# Patient Record
Sex: Female | Born: 1990 | Race: Black or African American | Hispanic: No | Marital: Single | Smoking: Never smoker
Health system: Southern US, Community
[De-identification: ages and names within clinical notes are randomized; demographics above are authoritative.]

## PROBLEM LIST (undated history)

## (undated) HISTORY — PX: ECTOPIC PREGNANCY SURGERY: SHX613

---
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
## 2011-02-04 ENCOUNTER — Emergency Department: Payer: Self-pay

## 2011-09-28 ENCOUNTER — Emergency Department: Payer: Self-pay | Admitting: Emergency Medicine

## 2011-09-28 LAB — CBC
HCT: 41.3 % (ref 35.0–47.0)
HGB: 12.7 g/dL (ref 12.0–16.0)
MCH: 25.7 pg — ABNORMAL LOW (ref 26.0–34.0)
MCHC: 30.7 g/dL — ABNORMAL LOW (ref 32.0–36.0)
MCV: 84 fL (ref 80–100)
Platelet: 270 10*3/uL (ref 150–440)
RDW: 13.3 % (ref 11.5–14.5)

## 2011-09-28 LAB — URINALYSIS, COMPLETE
Blood: NEGATIVE
Nitrite: NEGATIVE
Ph: 6 (ref 4.5–8.0)
Protein: NEGATIVE
RBC,UR: 1 /HPF (ref 0–5)
Specific Gravity: 1.028 (ref 1.003–1.030)
Squamous Epithelial: 6

## 2011-09-28 LAB — COMPREHENSIVE METABOLIC PANEL
Albumin: 3.9 g/dL (ref 3.4–5.0)
Anion Gap: 6 — ABNORMAL LOW (ref 7–16)
BUN: 14 mg/dL (ref 7–18)
Bilirubin,Total: 0.3 mg/dL (ref 0.2–1.0)
Chloride: 104 mmol/L (ref 98–107)
Co2: 29 mmol/L (ref 21–32)
EGFR (Non-African Amer.): >60
Osmolality: 278 (ref 275–301)
Sodium: 139 mmol/L (ref 136–145)

## 2011-09-28 LAB — PREGNANCY, URINE: Pregnancy Test, Urine: NEGATIVE m[IU]/mL

## 2011-09-28 LAB — LIPASE, BLOOD: Lipase: 166 U/L (ref 73–393)

## 2012-12-10 ENCOUNTER — Emergency Department: Payer: Self-pay | Admitting: Internal Medicine

## 2014-01-19 ENCOUNTER — Emergency Department: Payer: Self-pay | Admitting: Emergency Medicine

## 2014-04-10 ENCOUNTER — Emergency Department: Payer: Self-pay | Admitting: Emergency Medicine

## 2014-06-03 ENCOUNTER — Emergency Department: Payer: Self-pay | Admitting: Emergency Medicine

## 2014-06-03 LAB — HCG, QUANTITATIVE, PREGNANCY: Beta Hcg, Quant.: 6622 m[IU]/mL — ABNORMAL HIGH

## 2014-06-03 LAB — URINALYSIS, COMPLETE
BLOOD: NEGATIVE
Bilirubin,UR: NEGATIVE
GLUCOSE, UR: NEGATIVE mg/dL (ref 0–75)
KETONE: NEGATIVE
LEUKOCYTE ESTERASE: NEGATIVE
Nitrite: NEGATIVE
Ph: 6 (ref 4.5–8.0)
Protein: NEGATIVE
RBC,UR: 1 /HPF (ref 0–5)
SPECIFIC GRAVITY: 1.026 (ref 1.003–1.030)
Squamous Epithelial: 3
WBC UR: 1 /HPF (ref 0–5)

## 2014-06-03 LAB — CBC WITH DIFFERENTIAL/PLATELET
Basophil #: 0 10*3/uL (ref 0.0–0.1)
Basophil %: 0.4 %
EOS ABS: 0.2 10*3/uL (ref 0.0–0.7)
Eosinophil %: 2.7 %
HCT: 38.7 % (ref 35.0–47.0)
HGB: 12.4 g/dL (ref 12.0–16.0)
Lymphocyte #: 2.6 10*3/uL (ref 1.0–3.6)
Lymphocyte %: 34.7 %
MCH: 27.1 pg (ref 26.0–34.0)
MCHC: 32.1 g/dL (ref 32.0–36.0)
MCV: 85 fL (ref 80–100)
MONO ABS: 0.5 x10 3/mm (ref 0.2–0.9)
Monocyte %: 6.1 %
NEUTROS ABS: 4.1 10*3/uL (ref 1.4–6.5)
NEUTROS PCT: 56.1 %
Platelet: 252 10*3/uL (ref 150–440)
RBC: 4.58 10*6/uL (ref 3.80–5.20)
RDW: 13.2 % (ref 11.5–14.5)
WBC: 7.4 10*3/uL (ref 3.6–11.0)

## 2014-06-03 LAB — COMPREHENSIVE METABOLIC PANEL
ANION GAP: 7 (ref 7–16)
Albumin: 3.6 g/dL (ref 3.4–5.0)
Alkaline Phosphatase: 62 U/L
BUN: 15 mg/dL (ref 7–18)
Bilirubin,Total: 0.2 mg/dL (ref 0.2–1.0)
CHLORIDE: 105 mmol/L (ref 98–107)
Calcium, Total: 8.8 mg/dL (ref 8.5–10.1)
Co2: 28 mmol/L (ref 21–32)
Creatinine: 0.91 mg/dL (ref 0.60–1.30)
EGFR (Non-African Amer.): >60
Glucose: 118 mg/dL — ABNORMAL HIGH (ref 65–99)
Osmolality: 281 (ref 275–301)
POTASSIUM: 3.8 mmol/L (ref 3.5–5.1)
SGOT(AST): 20 U/L (ref 15–37)
SGPT (ALT): 20 U/L
Sodium: 140 mmol/L (ref 136–145)
TOTAL PROTEIN: 7.1 g/dL (ref 6.4–8.2)

## 2014-06-03 LAB — LIPASE, BLOOD: Lipase: 142 U/L (ref 73–393)

## 2014-06-04 ENCOUNTER — Ambulatory Visit: Payer: Self-pay | Admitting: Obstetrics and Gynecology

## 2014-06-04 LAB — CBC
HCT: 40.1 % (ref 35.0–47.0)
HGB: 12.9 g/dL (ref 12.0–16.0)
MCH: 27.2 pg (ref 26.0–34.0)
MCHC: 32.1 g/dL (ref 32.0–36.0)
MCV: 85 fL (ref 80–100)
PLATELETS: 246 10*3/uL (ref 150–440)
RBC: 4.73 10*6/uL (ref 3.80–5.20)
RDW: 13.2 % (ref 11.5–14.5)
WBC: 6.8 10*3/uL (ref 3.6–11.0)

## 2014-06-04 LAB — HCG, QUANTITATIVE, PREGNANCY: Beta Hcg, Quant.: 2699 m[IU]/mL — ABNORMAL HIGH

## 2014-09-10 LAB — SURGICAL PATHOLOGY

## 2014-09-16 NOTE — Op Note (Signed)
PATIENT NAME:  Megan Benson, Megan Benson MR#:  [id number] DATE OF BIRTH:  [birth year]  DATE OF PROCEDURE:  06/04/2014  PREOPERATIVE DIAGNOSIS:  Suspected ruptured right ectopic pregnancy.   POSTOPERATIVE DIAGNOSES:  1.  Suspected ruptured right ectopic pregnancy.  2.  Right ovarian cyst consistent with dermoid.   PROCEDURES:  1. Operative laparoscopy.  2. Right salpingectomy.  3. Removal of ectopic pregnancy.  4. Right ovarian cystectomy.   ANESTHESIA: General and local 0.5% Xylocaine plain.   SURGEON: Prentice Docker, MD.   ESTIMATED BLOOD LOSS: 75 mL.   OPERATIVE FLUIDS: 1200 mL crystalloid.   COMPLICATIONS: None.   FINDINGS:  1.  Large, edematous, right fallopian tube with likely mass in distal end with no evidence of torsion.  2.  Several centimeter cyst on right ovary with apparent hair-like contents, consistent with dermoid.  3.  Multiple filmy adhesions throughout the entire pelvis especially from the posterior uterus to the colon and right fallopian tube and left fallopian tube to the colon.  4.  Filmy string-like adhesion of liver to the anterior abdominal wall.   SPECIMENS:  1.  Right fallopian tube with suspected ectopic pregnancy contained.  2.  Right ovarian cyst.   CONDITION AT THE END OF THE PROCEDURE: Stable.   PROCEDURE IN DETAIL: The patient was taken to the operating room where general anesthesia was administered and found to be adequate. The patient was placed in the dorsal supine lithotomy position in [place] stirrups and prepped and draped in the usual fashion. Notably the patient was placed in a way as to minimize risk of nerve and vascular injury. After a timeout was called a red rubber catheter was placed in her bladder for in and out catheterization of about 200 mL of clear urine. A sterile speculum was placed in the vagina and a single-tooth tenaculum was affixed to the anterior lip of the cervix. An acorn uterine manipulator was affixed to the tenaculum. The  speculum was then removed.   Attention was turned to the abdomen where after injection of local anesthetic a supraumbilical 5 mm incision was made and entry was gained to the abdomen through a trocar using the Optiview trocar technique with the camera. Verification of entry was undertaken using opening pressures. After verification the abdomen was insufflated with CO2. The camera was reintroduced through the port and atraumatic entry was verified. A survey of the patient's pelvis and abdomen was undertaken with the above-noted findings after placing the patient in steep Trendelenburg. A left lower quadrant 12 mm port was placed under direct intra-abdominal camera visualization without difficulty. A 5 mm suprapubic port was placed under direct intra-abdominal camera visualization without difficulty. Each of these last 2 ports were placed after administration of local anesthetic. The right fallopian tube was identified and all filmy adhesions were taken down. The course of the fallopian tube was traced to insure that no torsion had taken place given that there was a mass on the right ovary noted. The tube coursed normally until it reached the ampullary end near the fimbria, where it became enlarged and was edematous and there appeared to be spillage of blood coming from that end. Starting in the medial portion of the fallopian tube the fallopian tube was grasped with a grasper and the Harmonic scalpel was used to transect the fallopian tube near the cornual region. Along the edge of the mesosalpinx the fallopian tube was dissected using Harmonic scalpel following it all the way out near the distal end. The distal fimbriated  end was then identified and grasped and bit by bit it was freed up to insure that the IP ligament was not included in the cauterization. At this point the right fallopian tube and contents were liberated from the right adnexa. Hemostasis was noted along the vascular pedicle.   Attention was  turned to the right ovary where there was noted to be a several centimeter cyst with apparent hair-like contents inside. This was elevated and then an attempt was made to cut through the ovarian stroma just underneath this tissue, however the cyst was somewhat transected using the Harmonic scalpel. Once the cyst was removed the specimen was removed through the left lower quadrant port using an Endo Catch bag. After removal of the cyst the ectopic pregnancy and fallopian tube were removed in a similar fashion through an Endo Catch bag through the left lower quadrant port. Given that the dermoid cyst was ruptured, the abdomen and pelvis were copiously irrigated and all products that could possibly be removed were attempted to be removed. The bed of the cyst was then cauterized using bipolar electrocautery to burn any remnants of the cyst wall. Hemostasis was noted. At this point a generalized inspection again was taken of the abdomen and pelvis to confirm the above-noted findings and that as much fluid as possible was removed from the pelvis and abdomen. After the patient was taken out of Trendelenburg another survey was taken to insure that all fluid was removed. At this point the vascular pedicle was hemostatic and the cyst bed of the ovary was hemostatic. This concluded the procedure. The abdomen was desufflated of CO2 after removal of all instrumentation. The trocars were removed. Each port site was closed in the subcutaneous area using a number 4-0 Vicryl and the left lower quadrant skin was closed in a subcuticular fashion using 4-0 Vicryl undyed in a subcuticular fashion. Each incision was reapproximated using Dermabond. An additional 10 mL of 0.5% Xylocaine plain was injected distributed among the 3 incision sites.   Attention was turned to the pelvic region again where after removal of the acorn manipulator a speculum was placed and the tenaculum was removed from the cervix and hemostasis was noted. The  speculum was removed from the vagina after verifying no other instrumentation or sponges were left in the vagina.   The patient tolerated the procedure well. Sponge, lap, and needle counts were correct x 2. The patient was awakened in the operating room and taken to the recovery area in stable condition.    ____________________________ Will Bonnet, MD sdj:bu D: 06/04/2014 19:00:00 ET T: 06/04/2014 20:29:37 ET JOB#: [id number]  cc: Will Bonnet, MD, <Dictator> Will Bonnet MD ELECTRONICALLY SIGNED 06/20/2014 0:08

## 2014-09-21 ENCOUNTER — Emergency Department
Admission: EM | Admit: 2014-09-21 | Discharge: 2014-09-21 | Disposition: A | Discharge status: Home / Self Care | Payer: 59 | Attending: Emergency Medicine | Admitting: Emergency Medicine

## 2014-09-21 ENCOUNTER — Encounter: Payer: Self-pay | Admitting: Emergency Medicine

## 2014-09-21 ENCOUNTER — Emergency Department: Payer: 59

## 2014-09-21 DIAGNOSIS — R102 Pelvic and perineal pain: Secondary | ICD-10-CM | POA: Diagnosis not present

## 2014-09-21 DIAGNOSIS — Z3202 Encounter for pregnancy test, result negative: Secondary | ICD-10-CM | POA: Insufficient documentation

## 2014-09-21 DIAGNOSIS — R1032 Left lower quadrant pain: Secondary | ICD-10-CM | POA: Diagnosis present

## 2014-09-21 LAB — URINALYSIS COMPLETE WITH MICROSCOPIC (ARMC ONLY)
BACTERIA UA: NONE SEEN
Bilirubin Urine: NEGATIVE
Glucose, UA: NEGATIVE mg/dL
Hgb urine dipstick: NEGATIVE
KETONES UR: NEGATIVE mg/dL
Leukocytes, UA: NEGATIVE
Nitrite: NEGATIVE
Protein, ur: NEGATIVE mg/dL
SPECIFIC GRAVITY, URINE: 1.025 (ref 1.005–1.030)
pH: 5 (ref 5.0–8.0)

## 2014-09-21 LAB — POCT PREGNANCY, URINE: Preg Test, Ur: NEGATIVE

## 2014-09-21 LAB — CBC WITH DIFFERENTIAL/PLATELET
Basophils Absolute: 0.1 10*3/uL (ref 0–0.1)
Basophils Relative: 1 %
Eosinophils Absolute: 0.7 10*3/uL (ref 0–0.7)
Eosinophils Relative: 7 %
HCT: 39.6 % (ref 35.0–47.0)
HEMOGLOBIN: 12.7 g/dL (ref 12.0–16.0)
LYMPHS ABS: 4.8 10*3/uL — AB (ref 1.0–3.6)
LYMPHS PCT: 50 %
MCH: 26.7 pg (ref 26.0–34.0)
MCHC: 32.2 g/dL (ref 32.0–36.0)
MCV: 83.1 fL (ref 80.0–100.0)
MONOS PCT: 6 %
Monocytes Absolute: 0.6 10*3/uL (ref 0.2–0.9)
NEUTROS ABS: 3.4 10*3/uL (ref 1.4–6.5)
Neutrophils Relative %: 36 %
PLATELETS: 226 10*3/uL (ref 150–440)
RBC: 4.76 MIL/uL (ref 3.80–5.20)
RDW: 12.6 % (ref 11.5–14.5)
WBC: 9.6 10*3/uL (ref 3.6–11.0)

## 2014-09-21 LAB — BASIC METABOLIC PANEL
ANION GAP: 5 (ref 5–15)
BUN: 11 mg/dL (ref 6–20)
CALCIUM: 8.8 mg/dL — AB (ref 8.9–10.3)
CHLORIDE: 106 mmol/L (ref 101–111)
CO2: 27 mmol/L (ref 22–32)
Creatinine, Ser: 0.93 mg/dL (ref 0.44–1.00)
GFR calc Af Amer: >60 mL/min (ref >60)
GFR calc non Af Amer: >60 mL/min (ref >60)
GLUCOSE: 90 mg/dL (ref 65–99)
POTASSIUM: 3.5 mmol/L (ref 3.5–5.1)
Sodium: 138 mmol/L (ref 135–145)

## 2014-09-21 MED ORDER — IBUPROFEN 400 MG PO TABS
ORAL_TABLET | ORAL | Status: AC
  Administered 2014-09-21: 800 mg via ORAL
  Filled 2014-09-21: qty 2

## 2014-09-21 MED ORDER — IBUPROFEN 800 MG PO TABS
800.0000 mg | ORAL_TABLET | Freq: Once | ORAL | Status: AC
  Administered 2014-09-21: 800 mg via ORAL

## 2014-09-21 NOTE — ED Notes (Signed)
Upreg negative.

## 2014-09-21 NOTE — Discharge Instructions (Signed)
Abdominal Pain     please follow up with [facility]. Call on Monday for an appointment this week.   Many things can cause belly (abdominal) pain. Most times, the belly pain is not dangerous. Many cases of belly pain can be watched and treated at home. HOME CARE   Do not take medicines that help you go poop (laxatives) unless told to by your doctor.  Only take medicine as told by your doctor.  Eat or drink as told by your doctor. Your doctor will tell you if you should be on a special diet. GET HELP IF:  You do not know what is causing your belly pain.  You have belly pain while you are sick to your stomach (nauseous) or have runny poop (diarrhea).  You have pain while you pee or poop.  Your belly pain wakes you up at night.  You have belly pain that gets worse or better when you eat.  You have belly pain that gets worse when you eat fatty foods.  You have a fever. GET HELP RIGHT AWAY IF:   The pain does not go away within 2 hours.  You keep throwing up (vomiting).  The pain changes and is only in the right or left part of the belly.  You have bloody or tarry looking poop. MAKE SURE YOU:   Understand these instructions.  Will watch your condition.  Will get help right away if you are not doing well or get worse. Document Released: 10/21/2007 Document Revised: 05/09/2013 Document Reviewed: 01/11/2013 [facility] Patient Information 2015 [place], [place]. This information is not intended to replace advice given to you by your health care provider. Make sure you discuss any questions you have with your health care provider.

## 2014-09-21 NOTE — ED Notes (Signed)
PT in with co left lower abd pain for a week, no n.v.d or dysuria.

## 2014-09-21 NOTE — ED Provider Notes (Signed)
[facility] Emergency Department Provider Note    ____________________________________________  Time seen: 1:28 AM  I have reviewed the triage vital signs and the nursing notes.   HISTORY  Chief Complaint Abdominal Pain       HPI Megan Benson is a 24 y.o. female who presents with left lower quadrant pain. Patient states that over the last month she has experienced off and on brief pains in the left lower abdomen. She is states that this does not seem to be associated with any fever, vaginal discharge, vomiting, or other concerns. She describes a pain that is achy and occasionally brief and sharp in the left lower quadrant. She did note that it can sometimes be worse with walking, but Present is only very minimal.  Patient does state she had a prior ectopic pregnancy which caused similar right-sided pain, however is not pregnant today.  Patient denies any vaginal discharge vaginal bleeding, or other concerns.  Patient is eating well.  Patient denies past medical history other than a previous ectopic that required surgery and removal of her right tube.  No past medical history on file.  There are no active problems to display for this patient.   No past surgical history on file.  No current outpatient prescriptions on file.  Allergies Review of patient's allergies indicates no known allergies.  No family history on file.  Social History History  Substance Use Topics  . Smoking status: Not on file  . Smokeless tobacco: Not on file  . Alcohol Use: Not on file   The patient does not smoke and does not drink and does not use any illegal drugs  Review of Systems  Constitutional: Negative for fever. Eyes: Negative for visual changes. ENT: Negative for sore throat. Cardiovascular: Negative for chest pain. Respiratory: Negative for shortness of breath. Gastrointestinal: Negative for abdominal pain except as noted in the history of  present illness. No right upper quadrant pain and no right lower quadrant pain, vomiting and diarrhea. Genitourinary: Negative for dysuria. Musculoskeletal: Negative for back pain. Skin: Negative for rash. Neurological: Negative for headaches, focal weakness or numbness.   10-point ROS otherwise negative.  ____________________________________________   PHYSICAL EXAM:  VITAL SIGNS: ED Triage Vitals  Enc Vitals Group     BP 09/21/14 0035 111/61 mmHg     Pulse Rate 09/21/14 0035 81     Resp 09/21/14 0035 18     Temp 09/21/14 0035 98.2 F (36.8 C)     Temp src --      SpO2 09/21/14 0035 100 %     Weight 09/21/14 0035 236 lb (107.049 kg)     Height 09/21/14 0035 5' (1.524 m)     Head Cir --      Peak Flow --      Pain Score 09/21/14 0035 7     Pain Loc --      Pain Edu? --      Excl. in [place]? --      Constitutional: Alert and oriented. Well appearing and in no distress. Eyes: Conjunctivae are normal. PERRL. Normal extraocular movements. ENT   Head: Normocephalic and atraumatic.   Nose: No congestion/rhinnorhea.   Mouth/Throat: Mucous membranes are moist.   Neck: No stridor. Hematological/Lymphatic/Immunilogical: No cervical lymphadenopathy. Cardiovascular: Normal rate, regular rhythm. Normal and symmetric distal pulses are present in all extremities. No murmurs, rubs, or gallops. Respiratory: Normal respiratory effort without tachypnea nor retractions. Breath sounds are clear and equal bilaterally. No wheezes/rales/rhonchi. Gastrointestinal:  Soft and nontender. No distention. No abdominal bruits. There is no CVA tenderness. With deep palpation of the left lower quadrant there is no noted pain at this time to exam. The patient's abdominal exam is completely normal. Genitourinary:  Musculoskeletal: Nontender with normal range of motion in all extremities. No joint effusions.  No lower extremity tenderness nor edema. Neurologic:  Normal speech and language. No gross  focal neurologic deficits are appreciated. Speech is normal. No gait instability. Skin:  Skin is warm, dry and intact. No rash noted. Psychiatric: Mood and affect are normal. Speech and behavior are normal. Patient exhibits appropriate insight and judgment.  ____________________________________________    LABS (pertinent positives/negatives)  Labs Reviewed  CBC WITH DIFFERENTIAL/PLATELET - Abnormal; Notable for the following:    Lymphs Abs 4.8 (*)    All other components within normal limits  BASIC METABOLIC PANEL - Abnormal; Notable for the following:    Calcium 8.8 (*)    All other components within normal limits  URINALYSIS COMPLETEWITH MICROSCOPIC ([facility])  - Abnormal; Notable for the following:    Color, Urine YELLOW (*)    APPearance HAZY (*)    Squamous Epithelial / LPF 6-30 (*)    All other components within normal limits  POC URINE PREG, ED   Urine pregnancy test was negative.  ____________________________________________   EKG     ____________________________________________    RADIOLOGY  TV US IMPRESSION: Simple unilocular 2.9 cm left ovarian cyst. Normal uterus and right ovary.  ____________________________________________   PROCEDURES  Procedure(s) performed: None  Critical Care performed: No  ____________________________________________   INITIAL IMPRESSION / ASSESSMENT AND PLAN / ED COURSE  Pertinent labs & imaging results that were available during my care of the patient were reviewed by me and considered in my medical decision making (see chart for details).  Patient presents with subacute left lower quadrant abdominal pain which is very brief and intermittent in nature. At present she describes that she only has a very mild achy sensation, occasionally the pain is sharp though. Patient is afebrile, her labs are reviewed and essentially no acute abnormalities are noted. Patient's lab exam is very much reassuring of the abdomen. At this  point, I suspect that there is unlikely to be an acute surgical process in the abdomen, and the patient's UPT is notably negative. At this point we will obtain a transvaginal ultrasound to evaluate for possible cystic process mass, or evidence that could suggest the patient may be experiencing intermittent torsion, though I feel clinically this is unlikely.  Insert time stamp. Patient resting comfortably at this time. Discussed with the patient that there is a small 3 cm ovarian cyst on the left. This may cause some occasional pain, but I am not 329% certain that this is the etiology of her discomfort. I did discuss with the patient signs and symptoms of torsion, and should she develop any sudden severe left lower quadrant or pelvic pain that she needs to seek medical attention in the ER right away.  Patient agrees to follow up with her OB doctor at [facility] in the next week.  Discharge the patient with return precautions including development of any fever, vomiting, diarrhea, vaginal discharge, worsening pain, continuous pain in the left lower quadrant, or other new concerns arise.  ____________________________________________   FINAL CLINICAL IMPRESSION(S) / ED DIAGNOSES  Final diagnoses:  Pelvic pain in female     Delman Kitten, MD 09/21/14 [phone]

## 2015-01-20 ENCOUNTER — Encounter: Payer: Self-pay | Admitting: Medical Oncology

## 2015-01-20 ENCOUNTER — Emergency Department
Admission: EM | Admit: 2015-01-20 | Discharge: 2015-01-20 | Disposition: A | Discharge status: Home / Self Care | Payer: 59 | Attending: Emergency Medicine | Admitting: Emergency Medicine

## 2015-01-20 DIAGNOSIS — R350 Frequency of micturition: Secondary | ICD-10-CM | POA: Diagnosis present

## 2015-01-20 DIAGNOSIS — N390 Urinary tract infection, site not specified: Secondary | ICD-10-CM | POA: Diagnosis not present

## 2015-01-20 LAB — URINALYSIS COMPLETE WITH MICROSCOPIC (ARMC ONLY)
BACTERIA UA: NONE SEEN
Bilirubin Urine: NEGATIVE
Glucose, UA: NEGATIVE mg/dL
Ketones, ur: NEGATIVE mg/dL
Nitrite: NEGATIVE
Protein, ur: 100 mg/dL — AB
Specific Gravity, Urine: 1.023 (ref 1.005–1.030)
pH: 6 (ref 5.0–8.0)

## 2015-01-20 MED ORDER — PHENAZOPYRIDINE HCL 200 MG PO TABS
200.0000 mg | ORAL_TABLET | Freq: Three times a day (TID) | ORAL | Status: DC | PRN
Start: 2015-01-20 — End: 2015-10-28

## 2015-01-20 MED ORDER — CEPHALEXIN 500 MG PO CAPS: 500.0000 mg | ORAL_CAPSULE | Freq: Two times a day (BID) | ORAL | Status: DC

## 2015-01-20 MED ORDER — CEPHALEXIN 500 MG PO CAPS
500.0000 mg | ORAL_CAPSULE | Freq: Once | ORAL | Status: AC
  Administered 2015-01-20: 500 mg via ORAL
  Filled 2015-01-20: qty 1

## 2015-01-20 NOTE — ED Notes (Signed)
Pt reports urinary pain and frequency that began today. Denies other sx's.

## 2015-01-20 NOTE — Discharge Instructions (Signed)
Urinary Tract Infection Urinary tract infections (UTIs) can develop anywhere along your urinary tract. Your urinary tract is your body's drainage system for removing wastes and extra water. Your urinary tract includes two kidneys, two ureters, a bladder, and a urethra. Your kidneys are a pair of bean-shaped organs. Each kidney is about the size of your fist. They are located below your ribs, one on each side of your spine. CAUSES Infections are caused by microbes, which are microscopic organisms, including fungi, viruses, and bacteria. These organisms are so small that they can only be seen through a microscope. Bacteria are the microbes that most commonly cause UTIs. SYMPTOMS  Symptoms of UTIs may vary by age and gender of the patient and by the location of the infection. Symptoms in young women typically include a frequent and intense urge to urinate and a painful, burning feeling in the bladder or urethra during urination. Older women and men are more likely to be tired, shaky, and weak and have muscle aches and abdominal pain. A fever may mean the infection is in your kidneys. Other symptoms of a kidney infection include pain in your back or sides below the ribs, nausea, and vomiting. DIAGNOSIS To diagnose a UTI, your caregiver will ask you about your symptoms. Your caregiver also will ask to provide a urine sample. The urine sample will be tested for bacteria and white blood cells. White blood cells are made by your body to help fight infection. TREATMENT  Typically, UTIs can be treated with medication. Because most UTIs are caused by a bacterial infection, they usually can be treated with the use of antibiotics. The choice of antibiotic and length of treatment depend on your symptoms and the type of bacteria causing your infection. HOME CARE INSTRUCTIONS  If you were prescribed antibiotics, take them exactly as your caregiver instructs you. Finish the medication even if you feel better after you  have only taken some of the medication.  Drink enough water and fluids to keep your urine clear or pale yellow.  Avoid caffeine, tea, and carbonated beverages. They tend to irritate your bladder.  Empty your bladder often. Avoid holding urine for long periods of time.  Empty your bladder before and after sexual intercourse.  After a bowel movement, women should cleanse from front to back. Use each tissue only once. SEEK MEDICAL CARE IF:   You have back pain.  You develop a fever.  Your symptoms do not begin to resolve within 3 days. SEEK IMMEDIATE MEDICAL CARE IF:   You have severe back pain or lower abdominal pain.  You develop chills.  You have nausea or vomiting.  You have continued burning or discomfort with urination. MAKE SURE YOU:   Understand these instructions.  Will watch your condition.  Will get help right away if you are not doing well or get worse. Document Released: 02/11/2005 Document Revised: 11/03/2011 Document Reviewed: 06/12/2011 [facility] Patient Information 2015 [place], [place]. This information is not intended to replace advice given to you by your health care provider. Make sure you discuss any questions you have with your health care provider.  Take the prescription antibiotic as directed until completely gone. Take the pain medicine as needed. Increase fluid intake to keep urine clear. Follow-up with [facility] as needed.

## 2015-01-20 NOTE — ED Provider Notes (Signed)
[facility] Emergency Department Provider Note ____________________________________________  Time seen: 1805  I have reviewed the triage vital signs and the nursing notes.  HISTORY  Chief Complaint  Urinary Frequency  HPI Megan Benson is a 24 y.o. female reports to the ED with sudden onset of urinary burning and urgency. She does note also some scant blood when she wipes. She denies any fevers, chills, or sweats. She is been without any pelvic pain, abdominal pain or flank pain.She reports her pain at a 6/10 in triage.  History reviewed. No pertinent past medical history.  There are no active problems to display for this patient.  Past Surgical History  Procedure Laterality Date  . Ectopic pregnancy surgery      Current Outpatient Rx  Name  Route  Sig  Dispense  Refill  . cephALEXin (KEFLEX) 500 MG capsule   Oral   Take 1 capsule (500 mg total) by mouth 2 (two) times daily.   14 capsule   0   . phenazopyridine (PYRIDIUM) 200 MG tablet   Oral   Take 1 tablet (200 mg total) by mouth 3 (three) times daily as needed for pain.   20 tablet   0    Allergies Review of patient's allergies indicates no known allergies.  No family history on file.  Social History Social History  Substance Use Topics  . Smoking status: Never Smoker   . Smokeless tobacco: None  . Alcohol Use: No   Review of Systems  Constitutional: Negative for fever. Eyes: Negative for visual changes. ENT: Negative for sore throat. Cardiovascular: Negative for chest pain. Respiratory: Negative for shortness of breath. Gastrointestinal: Negative for abdominal pain, vomiting and diarrhea. Genitourinary: Positive for dysuria. Musculoskeletal: Negative for back pain. Skin: Negative for rash. Neurological: Negative for headaches, focal weakness or numbness. ____________________________________________  PHYSICAL EXAM:  VITAL SIGNS: ED Triage Vitals  Enc Vitals Group    BP 01/20/15 1700 118/80 mmHg     Pulse Rate 01/20/15 1700 87     Resp 01/20/15 1700 18     Temp 01/20/15 1700 98.2 F (36.8 C)     Temp Source 01/20/15 1700 Oral     SpO2 01/20/15 1700 100 %     Weight 01/20/15 1700 236 lb (107.049 kg)     Height 01/20/15 1700 5' (1.524 m)     Head Cir --      Peak Flow --      Pain Score 01/20/15 1816 6     Pain Loc --      Pain Edu? --      Excl. in [place]? --    Constitutional: Alert and oriented. Well appearing and in no distress. Eyes: Conjunctivae are normal. PERRL. Normal extraocular movements. ENT   Head: Normocephalic and atraumatic.   Nose: No congestion/rhinorrhea.   Mouth/Throat: Mucous membranes are moist.   Neck: Supple. No thyromegaly. Hematological/Lymphatic/Immunological: No cervical lymphadenopathy. Cardiovascular: Normal rate, regular rhythm.  Respiratory: Normal respiratory effort. No wheezes/rales/rhonchi. Gastrointestinal: Soft and nontender. No distention. Musculoskeletal: Nontender with normal range of motion in all extremities.  Neurologic:  Normal gait without ataxia. Normal speech and language. No gross focal neurologic deficits are appreciated. Skin:  Skin is warm, dry and intact. No rash noted. Psychiatric: Mood and affect are normal. Patient exhibits appropriate insight and judgment. ____________________________________________    LABS (pertinent positives/negatives) Labs Reviewed  URINALYSIS COMPLETEWITH MICROSCOPIC ([place] ONLY) - Abnormal; Notable for the following:    Color, Urine YELLOW (*)  APPearance CLOUDY (*)    Hgb urine dipstick 3+ (*)    Protein, ur 100 (*)    Leukocytes, UA 3+ (*)    Squamous Epithelial / LPF 0-5 (*)    All other components within normal limits  URINE CULTURE  UA with WBCs TNTC ____________________________________________  PROCEDURES  Keflex 500 mg PO ____________________________________________  INITIAL IMPRESSION / ASSESSMENT AND PLAN / ED COURSE  Treatment  for an acute UTI with Keflex and phenazopyridine. Patient to follow-up with her primary care provider for ongoing symptoms. Urine culture is pending. ____________________________________________  FINAL CLINICAL IMPRESSION(S) / ED DIAGNOSES  Final diagnoses:  UTI (lower urinary tract infection)     Melvenia Needles, PA-C 01/20/15 2050  Nena Polio, MD 01/20/15 [phone]

## 2015-01-22 LAB — URINE CULTURE: SPECIAL REQUESTS: NORMAL

## 2015-02-26 ENCOUNTER — Emergency Department: Payer: 59

## 2015-02-26 ENCOUNTER — Encounter: Payer: Self-pay | Admitting: Emergency Medicine

## 2015-02-26 ENCOUNTER — Emergency Department
Admission: EM | Admit: 2015-02-26 | Discharge: 2015-02-26 | Disposition: A | Discharge status: Home / Self Care | Payer: 59 | Attending: Emergency Medicine | Admitting: Emergency Medicine

## 2015-02-26 DIAGNOSIS — R1032 Left lower quadrant pain: Secondary | ICD-10-CM | POA: Insufficient documentation

## 2015-02-26 DIAGNOSIS — Z3202 Encounter for pregnancy test, result negative: Secondary | ICD-10-CM | POA: Insufficient documentation

## 2015-02-26 DIAGNOSIS — Z792 Long term (current) use of antibiotics: Secondary | ICD-10-CM | POA: Insufficient documentation

## 2015-02-26 LAB — URINALYSIS COMPLETE WITH MICROSCOPIC (ARMC ONLY)
BILIRUBIN URINE: NEGATIVE
Bacteria, UA: NONE SEEN
GLUCOSE, UA: NEGATIVE mg/dL
Hgb urine dipstick: NEGATIVE
Ketones, ur: NEGATIVE mg/dL
Leukocytes, UA: NEGATIVE
NITRITE: NEGATIVE
Protein, ur: NEGATIVE mg/dL
SPECIFIC GRAVITY, URINE: 1.023 (ref 1.005–1.030)
pH: 6 (ref 5.0–8.0)

## 2015-02-26 LAB — POCT PREGNANCY, URINE: Preg Test, Ur: NEGATIVE

## 2015-02-26 MED ORDER — ASPIRIN-ACETAMINOPHEN-CAFFEINE 250-250-65 MG PO TABS: 1.0000 | ORAL_TABLET | Freq: Three times a day (TID) | ORAL | Status: DC | PRN

## 2015-02-26 MED ORDER — IBUPROFEN 800 MG PO TABS: 800.0000 mg | ORAL_TABLET | Freq: Three times a day (TID) | ORAL | Status: DC | PRN

## 2015-02-26 NOTE — ED Provider Notes (Signed)
[facility] Emergency Department Provider Note  ____________________________________________  Time seen: Approximately 12:42 PM  I have reviewed the triage vital signs and the nursing notes.   HISTORY  Chief Complaint Abdominal Pain    HPI Megan Benson is a 24 y.o. female who presents to the emergency department complaining of left lower quadrant pain. She states that she has a known ovarian cyst that was diagnosed approximately 5 months ago. She states that she will occasionally have intermittent left lower quadrant pain but this pain is lasted for 5 days. She denies any vaginal bleeding or discharge. She denies any fever or chills. She denies any nausea or vomiting, diarrhea or constipation. The patient states that she has intermittent periods and does not remember the last menstrual period. Patient states that the pain is sharp and a pressure sensation. It comes" pulses". She has not tried anything over-the-counter to relieve symptoms.   History reviewed. No pertinent past medical history.  There are no active problems to display for this patient.   Past Surgical History  Procedure Laterality Date  . Ectopic pregnancy surgery      Current Outpatient Rx  Name  Route  Sig  Dispense  Refill  . aspirin-acetaminophen-caffeine (PAMPRIN MAX) 250-250-65 MG tablet   Oral   Take 1 tablet by mouth every 8 (eight) hours as needed for headache.   30 tablet   0   . cephALEXin (KEFLEX) 500 MG capsule   Oral   Take 1 capsule (500 mg total) by mouth 2 (two) times daily.   14 capsule   0   . ibuprofen (ADVIL,MOTRIN) 800 MG tablet   Oral   Take 1 tablet (800 mg total) by mouth every 8 (eight) hours as needed.   30 tablet   0   . phenazopyridine (PYRIDIUM) 200 MG tablet   Oral   Take 1 tablet (200 mg total) by mouth 3 (three) times daily as needed for pain.   20 tablet   0     Allergies Review of patient's allergies indicates no known  allergies.  No family history on file.  Social History Social History  Substance Use Topics  . Smoking status: Never Smoker   . Smokeless tobacco: None  . Alcohol Use: No    Review of Systems Constitutional: No fever/chills Eyes: No visual changes. ENT: No sore throat. Cardiovascular: Denies chest pain. Respiratory: Denies shortness of breath. Gastrointestinal: Endorses left lower quadrant abdominal pain.  No nausea, no vomiting.  No diarrhea.  No constipation. Genitourinary: Negative for dysuria. Musculoskeletal: Negative for back pain. Skin: Negative for rash. Neurological: Negative for headaches, focal weakness or numbness.  10-point ROS otherwise negative.  ____________________________________________   PHYSICAL EXAM:  VITAL SIGNS: ED Triage Vitals  Enc Vitals Group     BP 02/26/15 1225 129/83 mmHg     Pulse Rate 02/26/15 1225 81     Resp 02/26/15 1225 20     Temp 02/26/15 1225 98.4 F (36.9 C)     Temp Source 02/26/15 1225 Oral     SpO2 02/26/15 1225 96 %     Weight 02/26/15 1225 236 lb (107.049 kg)     Height 02/26/15 1225 5' (1.524 m)     Head Cir --      Peak Flow --      Pain Score 02/26/15 1223 7     Pain Loc --      Pain Edu? --      Excl. in [place]? --  Constitutional: Alert and oriented. Well appearing and in no acute distress. Eyes: Conjunctivae are normal. PERRL. EOMI. Head: Atraumatic. Nose: No congestion/rhinnorhea. Mouth/Throat: Mucous membranes are moist.  Oropharynx non-erythematous. Neck: No stridor.   Cardiovascular: Normal rate, regular rhythm. Grossly normal heart sounds.  Good peripheral circulation. Respiratory: Normal respiratory effort.  No retractions. Lungs CTAB. Gastrointestinal: Soft and nontender. No distention. No abdominal bruits. No CVA tenderness. Musculoskeletal: No lower extremity tenderness nor edema.  No joint effusions. Neurologic:  Normal speech and language. No gross focal neurologic deficits are appreciated. No  gait instability. Skin:  Skin is warm, dry and intact. No rash noted. Psychiatric: Mood and affect are normal. Speech and behavior are normal.  ____________________________________________   LABS (all labs ordered are listed, but only abnormal results are displayed)  Labs Reviewed  URINALYSIS COMPLETEWITH MICROSCOPIC ([place] ONLY) - Abnormal; Notable for the following:    Color, Urine YELLOW (*)    APPearance HAZY (*)    Squamous Epithelial / LPF 6-30 (*)    All other components within normal limits  POC URINE PREG, ED  POCT PREGNANCY, URINE   ____________________________________________  EKG   ____________________________________________  RADIOLOGY  Complete pelvic ultrasound Impression: ____________________________________________   PROCEDURES  Procedure(s) performed: None  Critical Care performed: No  ____________________________________________   INITIAL IMPRESSION / ASSESSMENT AND PLAN / ED COURSE  Pertinent labs & imaging results that were available during my care of the patient were reviewed by me and considered in my medical decision making (see chart for details).  The patient's history, symptoms, physical exam, and radiological findings were reviewed. The patient's ovarian cyst on the left side is now resolved.. Advised patient of findings and diagnosis. There is no acute findings on ultrasound and this symptomatology could be related to an onset of her menstrual period. There are no concerning symptoms at this time with a negative ultrasound and no acute findings on physical exam. Will discharge the patient home with strict ED precautions. I will prescribe the patient medication for symptom control to include 100 mg of ibuprofen and Pamprin.. The patient is to follow-up with OB/GYN. Verbalizes understanding and compliance with treatment plan. ____________________________________________   FINAL CLINICAL IMPRESSION(S) / ED DIAGNOSES  Final diagnoses:  LLQ  abdominal pain      Darletta Moll, PA-C 02/26/15 [place], MD 02/27/15 [phone]

## 2015-02-26 NOTE — Discharge Instructions (Signed)

## 2015-02-26 NOTE — ED Notes (Signed)
Pt here awhile back for same, did not f/u with gyn, here for same ovarian pain.

## 2015-02-26 NOTE — ED Notes (Addendum)
States she has an ovarian cyst on left   Now having left sided abd pain. Denies any fever or vaginal discharge states she was dx'd here .Marland Kitchen And pain has been daily but increases at times  But has not taken anything OTC for pain

## 2015-08-24 ENCOUNTER — Emergency Department
Admission: EM | Admit: 2015-08-24 | Discharge: 2015-08-24 | Disposition: A | Discharge status: Home / Self Care | Payer: 59 | Attending: Emergency Medicine | Admitting: Emergency Medicine

## 2015-08-24 ENCOUNTER — Encounter: Payer: Self-pay | Admitting: Emergency Medicine

## 2015-08-24 DIAGNOSIS — N390 Urinary tract infection, site not specified: Secondary | ICD-10-CM | POA: Insufficient documentation

## 2015-08-24 DIAGNOSIS — R350 Frequency of micturition: Secondary | ICD-10-CM | POA: Diagnosis present

## 2015-08-24 LAB — URINALYSIS COMPLETE WITH MICROSCOPIC (ARMC ONLY)
BILIRUBIN URINE: NEGATIVE
GLUCOSE, UA: NEGATIVE mg/dL
KETONES UR: NEGATIVE mg/dL
Nitrite: NEGATIVE
PH: 6 (ref 5.0–8.0)
Protein, ur: 30 mg/dL — AB
Specific Gravity, Urine: 1.013 (ref 1.005–1.030)

## 2015-08-24 LAB — POCT PREGNANCY, URINE: Preg Test, Ur: NEGATIVE

## 2015-08-24 MED ORDER — PHENAZOPYRIDINE HCL 200 MG PO TABS: 200.0000 mg | ORAL_TABLET | Freq: Three times a day (TID) | ORAL | Status: DC | PRN

## 2015-08-24 MED ORDER — PHENAZOPYRIDINE HCL 200 MG PO TABS
200.0000 mg | ORAL_TABLET | Freq: Once | ORAL | Status: AC
  Administered 2015-08-24: 200 mg via ORAL
  Filled 2015-08-24: qty 1

## 2015-08-24 MED ORDER — SULFAMETHOXAZOLE-TRIMETHOPRIM 800-160 MG PO TABS: 1.0000 | ORAL_TABLET | Freq: Two times a day (BID) | ORAL | Status: DC

## 2015-08-24 MED ORDER — SULFAMETHOXAZOLE-TRIMETHOPRIM 800-160 MG PO TABS
1.0000 | ORAL_TABLET | Freq: Once | ORAL | Status: AC
  Administered 2015-08-24: 1 via ORAL
  Filled 2015-08-24: qty 1

## 2015-08-24 NOTE — ED Provider Notes (Signed)
[facility] Emergency Department Provider Note  ____________________________________________  Time seen: Approximately 7:12 PM  I have reviewed the triage vital signs and the nursing notes.   HISTORY  Chief Complaint Urinary Frequency    HPI Megan Benson is a 25 y.o. female patient complain of urinary frequency and dysuria. Patient states  incident started yesterday. Patient denies any vaginal discharge, flank pain, or fever. Patient denies any pain at this time. Patient is taking cranberry pills to help alleviate her symptoms. No other palliative measures for this complaint.  History reviewed. No pertinent past medical history.  There are no active problems to display for this patient.   Past Surgical History  Procedure Laterality Date  . Ectopic pregnancy surgery      Current Outpatient Rx  Name  Route  Sig  Dispense  Refill  . aspirin-acetaminophen-caffeine (PAMPRIN MAX) 250-250-65 MG tablet   Oral   Take 1 tablet by mouth every 8 (eight) hours as needed for headache.   30 tablet   0   . cephALEXin (KEFLEX) 500 MG capsule   Oral   Take 1 capsule (500 mg total) by mouth 2 (two) times daily.   14 capsule   0   . ibuprofen (ADVIL,MOTRIN) 800 MG tablet   Oral   Take 1 tablet (800 mg total) by mouth every 8 (eight) hours as needed.   30 tablet   0   . phenazopyridine (PYRIDIUM) 200 MG tablet   Oral   Take 1 tablet (200 mg total) by mouth 3 (three) times daily as needed for pain.   20 tablet   0   . phenazopyridine (PYRIDIUM) 200 MG tablet   Oral   Take 1 tablet (200 mg total) by mouth 3 (three) times daily as needed for pain.   6 tablet   0   . sulfamethoxazole-trimethoprim (BACTRIM DS,SEPTRA DS) 800-160 MG tablet   Oral   Take 1 tablet by mouth 2 (two) times daily.   20 tablet   0     Allergies Review of patient's allergies indicates no known allergies.  History reviewed. No pertinent family history.  Social  History Social History  Substance Use Topics  . Smoking status: Never Smoker   . Smokeless tobacco: None  . Alcohol Use: No    Review of Systems Constitutional: No fever/chills Eyes: No visual changes. ENT: No sore throat. Cardiovascular: Denies chest pain. Respiratory: Denies shortness of breath. Gastrointestinal: No abdominal pain.  No nausea, no vomiting.  No diarrhea.  No constipation. Genitourinary: Positive for dysuria. Musculoskeletal: Negative for back pain. Skin: Negative for rash. Neurological: Negative for headaches, focal weakness or numbness.    ____________________________________________   PHYSICAL EXAM:  VITAL SIGNS: ED Triage Vitals  Enc Vitals Group     BP 08/24/15 1837 130/71 mmHg     Pulse Rate 08/24/15 1837 81     Resp 08/24/15 1837 18     Temp 08/24/15 1837 98.4 F (36.9 C)     Temp Source 08/24/15 1837 Oral     SpO2 08/24/15 1837 100 %     Weight 08/24/15 1837 236 lb (107.049 kg)     Height 08/24/15 1837 5' (1.524 m)     Head Cir --      Peak Flow --      Pain Score --      Pain Loc --      Pain Edu? --      Excl. in [place]? --  Constitutional: Alert and oriented. Well appearing and in no acute distress. Eyes: Conjunctivae are normal. PERRL. EOMI. Head: Atraumatic. Nose: No congestion/rhinnorhea. Mouth/Throat: Mucous membranes are moist.  Oropharynx non-erythematous. Neck: No stridor.  No cervical spine tenderness to palpation. Hematological/Lymphatic/Immunilogical: No cervical lymphadenopathy. Cardiovascular: Normal rate, regular rhythm. Grossly normal heart sounds.  Good peripheral circulation. Respiratory: Normal respiratory effort.  No retractions. Lungs CTAB. Gastrointestinal: Soft and nontender. No distention. No abdominal bruits. No CVA tenderness. Musculoskeletal: No lower extremity tenderness nor edema.  No joint effusions. Neurologic:  Normal speech and language. No gross focal neurologic deficits are appreciated. No gait  instability. Skin:  Skin is warm, dry and intact. No rash noted. Psychiatric: Mood and affect are normal. Speech and behavior are normal.  ____________________________________________   LABS (all labs ordered are listed, but only abnormal results are displayed)  Labs Reviewed  URINALYSIS COMPLETEWITH MICROSCOPIC ([place]) - Abnormal; Notable for the following:    Color, Urine YELLOW (*)    APPearance CLOUDY (*)    Hgb urine dipstick 2+ (*)    Protein, ur 30 (*)    Leukocytes, UA 1+ (*)    Bacteria, UA RARE (*)    Squamous Epithelial / LPF 6-30 (*)    All other components within normal limits  POCT PREGNANCY, URINE  POC URINE PREG, ED   ____________________________________________  EKG   ____________________________________________  RADIOLOGY   ____________________________________________   PROCEDURES  Procedure(s) performed: None  Critical Care performed: No  ____________________________________________   INITIAL IMPRESSION / ASSESSMENT AND PLAN / ED COURSE  Pertinent labs & imaging results that were available during my care of the patient were reviewed by me and considered in my medical decision making (see chart for details).  Urinary tract infection. Discussed with lab results with patient. Patient given discharge care instructions. Patient given prescription for Bactrim and Pyridium. Patient advised to follow-up with [facility] in 10 days to retest a urine. ____________________________________________   FINAL CLINICAL IMPRESSION(S) / ED DIAGNOSES  Final diagnoses:  UTI (lower urinary tract infection)      Sable Feil, PA-C 08/24/15 1920  Earleen Newport, MD 08/24/15 [phone]

## 2015-08-24 NOTE — ED Notes (Signed)
Pt complains of urinary frequency that started yesterday.

## 2015-08-24 NOTE — ED Notes (Signed)
Patient c/o pain when voiding and frequency. Symptoms started yesterday. Patient denies fever or chills

## 2015-08-24 NOTE — ED Notes (Signed)
Discharge instructions reviewed with patient. Patient verbalized understanding. Patient ambulated to lobby without difficulty.   

## 2015-10-28 ENCOUNTER — Encounter: Payer: Self-pay | Admitting: Emergency Medicine

## 2015-10-28 ENCOUNTER — Emergency Department
Admission: EM | Admit: 2015-10-28 | Discharge: 2015-10-28 | Disposition: A | Discharge status: Home / Self Care | Payer: 59 | Attending: Emergency Medicine | Admitting: Emergency Medicine

## 2015-10-28 ENCOUNTER — Emergency Department: Payer: 59

## 2015-10-28 DIAGNOSIS — Z7982 Long term (current) use of aspirin: Secondary | ICD-10-CM | POA: Insufficient documentation

## 2015-10-28 DIAGNOSIS — M25531 Pain in right wrist: Secondary | ICD-10-CM | POA: Diagnosis present

## 2015-10-28 LAB — URINALYSIS COMPLETE WITH MICROSCOPIC (ARMC ONLY)
Bacteria, UA: NONE SEEN
Bilirubin Urine: NEGATIVE
GLUCOSE, UA: NEGATIVE mg/dL
Hgb urine dipstick: NEGATIVE
Ketones, ur: NEGATIVE mg/dL
LEUKOCYTES UA: NEGATIVE
NITRITE: NEGATIVE
Protein, ur: NEGATIVE mg/dL
SPECIFIC GRAVITY, URINE: 1.015 (ref 1.005–1.030)
pH: 6 (ref 5.0–8.0)

## 2015-10-28 MED ORDER — MELOXICAM 15 MG PO TABS: 15.0000 mg | ORAL_TABLET | Freq: Every day | ORAL | Status: DC

## 2015-10-28 NOTE — ED Notes (Signed)
Patient presents to the ED with chronic right wrist pain.  Patient states it has been worse for the past week.  Patient states, "I think I might have carpal tunnel."  Patient also states that on Saturday she noticed some urinary frequency and dysuria but began drinking more water and took a cranberry pill.  Patient states now she is not having frequency or dysuria.  Patient is in no obvious distress at this time.

## 2015-10-28 NOTE — ED Notes (Signed)
See triage note  Right arm/wrist pain w/o injury positive pulses  No deformity  Also having some urinary freq and pain

## 2015-10-28 NOTE — ED Provider Notes (Signed)
[facility] Emergency Department Provider Note  ____________________________________________  Time seen: Approximately 10:41 AM  I have reviewed the triage vital signs and the nursing notes.   HISTORY  Chief Complaint Arm Pain    HPI Megan Benson is a 25 y.o. female who presents for evaluation of right arm wrist pain. Patient denies any injury states that she feels like she may have "carpal tunnel". States her pain is a 7/10 nonradiating. Complains of numbness and tingling in the fingers.   History reviewed. No pertinent past medical history.  There are no active problems to display for this patient.   Past Surgical History  Procedure Laterality Date  . Ectopic pregnancy surgery      Current Outpatient Rx  Name  Route  Sig  Dispense  Refill  . aspirin-acetaminophen-caffeine (PAMPRIN MAX) 250-250-65 MG tablet   Oral   Take 1 tablet by mouth every 8 (eight) hours as needed for headache.   30 tablet   0   . meloxicam (MOBIC) 15 MG tablet   Oral   Take 1 tablet (15 mg total) by mouth daily.   30 tablet   0     Allergies Review of patient's allergies indicates no known allergies.  No family history on file.  Social History Social History  Substance Use Topics  . Smoking status: Never Smoker   . Smokeless tobacco: None  . Alcohol Use: No    Review of Systems Constitutional: No fever/chills Cardiovascular: Denies chest pain. Respiratory: Denies shortness of breath. Musculoskeletal: Positive for right wrist pain. Skin: Negative for rash. Neurological: Negative for headaches, focal weakness or numbness.  10-point ROS otherwise negative.  ____________________________________________   PHYSICAL EXAM:  VITAL SIGNS: ED Triage Vitals  Enc Vitals Group     BP 10/28/15 1018 125/74 mmHg     Pulse Rate 10/28/15 1018 88     Resp 10/28/15 1018 16     Temp 10/28/15 1018 98.3 F (36.8 C)     Temp Source 10/28/15 1018 Oral   SpO2 10/28/15 1018 100 %     Weight 10/28/15 1018 236 lb (107.049 kg)     Height 10/28/15 1018 5' (1.524 m)     Head Cir --      Peak Flow --      Pain Score 10/28/15 1019 7     Pain Loc --      Pain Edu? --      Excl. in [place]? --     Constitutional: Alert and oriented. Well appearing and in no acute distress. Musculoskeletal: Right wrist with full range of motion negative Tinel's. Distally neurovascularly intact. No obvious swelling or skin discoloration. Neurologic:  Normal speech and language. No gross focal neurologic deficits are appreciated. No gait instability. Skin:  Skin is warm, dry and intact. No rash noted. Psychiatric: Mood and affect are normal. Speech and behavior are normal.  ____________________________________________   LABS (all labs ordered are listed, but only abnormal results are displayed)  Labs Reviewed  URINALYSIS COMPLETEWITH MICROSCOPIC ([facility] ONLY) - Abnormal; Notable for the following:    Color, Urine YELLOW (*)    APPearance CLOUDY (*)    Squamous Epithelial / LPF 6-30 (*)    All other components within normal limits   ____________________________________________  EKG   ____________________________________________  RADIOLOGY  FINDINGS: There is no evidence of fracture or dislocation. There is no evidence of arthropathy or other focal bone abnormality. Mild ulnar minus variance is noted. Soft tissues are unremarkable.  IMPRESSION: Mild ulnar minus variance. Otherwise negative. ____________________________________________   PROCEDURES  Procedure(s) performed: None  Critical Care performed: No  ____________________________________________   INITIAL IMPRESSION / ASSESSMENT AND PLAN / ED COURSE  Pertinent labs & imaging results that were available during my care of the patient were reviewed by me and considered in my medical decision making (see chart for details).  Recurrent right wrist pain. Consider carpal bone for further to  orthopedics for follow-up evaluation if no better with anti-inflammatories and wrist splint. ____________________________________________   FINAL CLINICAL IMPRESSION(S) / ED DIAGNOSES  Final diagnoses:  Wrist pain, acute, right     This chart was dictated using voice recognition software/Dragon. Despite best efforts to proofread, errors can occur which can change the meaning. Any change was purely unintentional.   Arlyss Repress, PA-C 10/28/15 1126  Daymon Larsen, MD 10/28/15 [phone]

## 2015-10-28 NOTE — Discharge Instructions (Signed)
Heat Therapy Heat therapy can help ease sore, stiff, injured, and tight muscles and joints. Heat relaxes your muscles, which may help ease your pain.  RISKS AND COMPLICATIONS If you have any of the following conditions, do not use heat therapy unless your health care provider has approved:  Poor circulation.  Healing wounds or scarred skin in the area being treated.  Diabetes, heart disease, or high blood pressure.  Not being able to feel (numbness) the area being treated.  Unusual swelling of the area being treated.  Active infections.  Blood clots.  Cancer.  Inability to communicate pain. This may include young children and people who have problems with their brain function (dementia).  Pregnancy. Heat therapy should only be used on old, pre-existing, or long-lasting (chronic) injuries. Do not use heat therapy on new injuries unless directed by your health care provider. HOW TO USE HEAT THERAPY There are several different kinds of heat therapy, including:  Moist heat pack.  Warm water bath.  Hot water bottle.  Electric heating pad.  Heated gel pack.  Heated wrap.  Electric heating pad. Use the heat therapy method suggested by your health care provider. Follow your health care provider's instructions on when and how to use heat therapy. GENERAL HEAT THERAPY RECOMMENDATIONS  Do not sleep while using heat therapy. Only use heat therapy while you are awake.  Your skin may turn pink while using heat therapy. Do not use heat therapy if your skin turns red.  Do not use heat therapy if you have new pain.  High heat or long exposure to heat can cause burns. Be careful when using heat therapy to avoid burning your skin.  Do not use heat therapy on areas of your skin that are already irritated, such as with a rash or sunburn. SEEK MEDICAL CARE IF:  You have blisters, redness, swelling, or numbness.  You have new pain.  Your pain is worse. MAKE SURE  YOU:  Understand these instructions.  Will watch your condition.  Will get help right away if you are not doing well or get worse.   This information is not intended to replace advice given to you by your health care provider. Make sure you discuss any questions you have with your health care provider.   Document Released: 07/27/2011 Document Revised: 05/25/2014 Document Reviewed: 06/27/2013 Elsevier Interactive Patient Education 2016 [employer]  Wrist Pain There are many things that can cause wrist pain. Some common causes include:  An injury to the wrist area, such as a sprain, strain, or fracture.  Overuse of the joint.  A condition that causes increased pressure on a nerve in the wrist (carpal tunnel syndrome).  Wear and tear of the joints that occurs with aging (osteoarthritis).  A variety of other types of arthritis. Sometimes, the cause of wrist pain is not known. The pain often goes away when you follow your health care provider's instructions for relieving pain at home. If your wrist pain continues, tests may need to be done to diagnose your condition. HOME CARE INSTRUCTIONS Pay attention to any changes in your symptoms. Take these actions to help with your pain:  Rest the wrist area for at least 48 hours or as told by your health care provider.  If directed, apply ice to the injured area:  Put ice in a plastic bag.  Place a towel between your skin and the bag.  Leave the ice on for 20 minutes, 2-3 times per day.  Keep your arm  raised (elevated) above the level of your heart while you are sitting or lying down.  If a splint or elastic bandage has been applied, use it as told by your health care provider.  Remove the splint or bandage only as told by your health care provider.  Loosen the splint or bandage if your fingers become numb or have a tingling feeling, or if they turn cold or blue.  Take over-the-counter and prescription medicines only as told by  your health care provider.  Keep all follow-up visits as told by your health care provider. This is important. SEEK MEDICAL CARE IF:  Your pain is not helped by treatment.  Your pain gets worse. SEEK IMMEDIATE MEDICAL CARE IF:  Your fingers become swollen.  Your fingers turn white, very red, or cold and blue.  Your fingers are numb or have a tingling feeling.  You have difficulty moving your fingers.   This information is not intended to replace advice given to you by your health care provider. Make sure you discuss any questions you have with your health care provider.   Document Released: 02/11/2005 Document Revised: 01/23/2015 Document Reviewed: 09/19/2014 Elsevier Interactive Patient Education [employer].

## 2015-10-29 ENCOUNTER — Emergency Department
Admission: EM | Admit: 2015-10-29 | Discharge: 2015-10-29 | Disposition: A | Discharge status: Home / Self Care | Payer: 59 | Attending: Emergency Medicine | Admitting: Emergency Medicine

## 2015-10-29 DIAGNOSIS — Z7982 Long term (current) use of aspirin: Secondary | ICD-10-CM | POA: Insufficient documentation

## 2015-10-29 DIAGNOSIS — M25531 Pain in right wrist: Secondary | ICD-10-CM | POA: Diagnosis not present

## 2015-10-29 DIAGNOSIS — N912 Amenorrhea, unspecified: Secondary | ICD-10-CM | POA: Insufficient documentation

## 2015-10-29 DIAGNOSIS — Z79899 Other long term (current) drug therapy: Secondary | ICD-10-CM | POA: Insufficient documentation

## 2015-10-29 DIAGNOSIS — R350 Frequency of micturition: Secondary | ICD-10-CM | POA: Diagnosis present

## 2015-10-29 DIAGNOSIS — R300 Dysuria: Secondary | ICD-10-CM | POA: Insufficient documentation

## 2015-10-29 LAB — POCT PREGNANCY, URINE: PREG TEST UR: NEGATIVE

## 2015-10-29 MED ORDER — PHENAZOPYRIDINE HCL 200 MG PO TABS
200.0000 mg | ORAL_TABLET | Freq: Once | ORAL | Status: AC
  Administered 2015-10-29: 200 mg via ORAL
  Filled 2015-10-29: qty 1

## 2015-10-29 MED ORDER — PHENAZOPYRIDINE HCL 200 MG PO TABS: 200.0000 mg | ORAL_TABLET | Freq: Three times a day (TID) | ORAL | Status: DC | PRN

## 2015-10-29 NOTE — Discharge Instructions (Signed)
Dysuria Dysuria is pain or discomfort while urinating. The pain or discomfort may be felt in the tube that carries urine out of the bladder (urethra) or in the surrounding tissue of the genitals. The pain may also be felt in the groin area, lower abdomen, and lower back. You may have to urinate frequently or have the sudden feeling that you have to urinate (urgency). Dysuria can affect both men and women, but is more common in women. Dysuria can be caused by many different things, including:  Urinary tract infection in women.  Infection of the kidney or bladder.  Kidney stones or bladder stones.  Certain sexually transmitted infections (STIs), such as chlamydia.  Dehydration.  Inflammation of the vagina.  Use of certain medicines.  Use of certain soaps or scented products that cause irritation. HOME CARE INSTRUCTIONS Watch your dysuria for any changes. The following actions may help to reduce any discomfort you are feeling:  Drink enough fluid to keep your urine clear or pale yellow.  Empty your bladder often. Avoid holding urine for long periods of time.  After a bowel movement or urination, women should cleanse from front to back, using each tissue only once.  Empty your bladder after sexual intercourse.  Take medicines only as directed by your health care provider.  If you were prescribed an antibiotic medicine, finish it all even if you start to feel better.  Avoid caffeine, tea, and alcohol. They can irritate the bladder and make dysuria worse. In men, alcohol may irritate the prostate.  Keep all follow-up visits as directed by your health care provider. This is important.  If you had any tests done to find the cause of dysuria, it is your responsibility to obtain your test results. Ask the lab or department performing the test when and how you will get your results. Talk with your health care provider if you have any questions about your results. SEEK MEDICAL CARE  IF:  You develop pain in your back or sides.  You have a fever.  You have nausea or vomiting.  You have blood in your urine.  You are not urinating as often as you usually do. SEEK IMMEDIATE MEDICAL CARE IF:  You pain is severe and not relieved with medicines.  You are unable to hold down any fluids.  You or someone else notices a change in your mental function.  You have a rapid heartbeat at rest.  You have shaking or chills.  You feel extremely weak.   This information is not intended to replace advice given to you by your health care provider. Make sure you discuss any questions you have with your health care provider.   Document Released: 01/31/2004 Document Revised: 05/25/2014 Document Reviewed: 12/28/2013 Elsevier Interactive Patient Education 2016 [employer].  Primary Amenorrhea Primary amenorrhea is the absence of any menstrual flow in a female by the age of 29 years. An average age for the start of menstruation is the age of 50 years. Primary amenorrhea is not considered to have occurred until a female is older than 15 years and has never menstruated. This may occur with or without other signs of puberty. CAUSES  Some common causes of not menstruating include:  Chromosomal abnormality causing the ovaries to malfunction is the most common cause of primary amenorrhea.  Malnutrition.  Low blood sugar (hypoglycemia).  Polycystic ovary syndrome (cysts in the ovaries, not ovulating).  Absence of the vagina, uterus, or ovaries since birth (congenital).  Extreme obesity.  Cystic fibrosis.  Drastic weight loss from any cause.  Over-exercising (running, biking) causing loss of body fat.  Pituitary gland tumor in the brain.  Long-term (chronic) illnesses.  Cushing disease.  Thyroid disease (hypothyroidism, hyperthyroidism).  Part of the brain (hypothalamus) not functioning normally.  Premature ovarian failure. SYMPTOMS  No menstruation by age 48 years  in normally developed females is the primary symptom. Other symptoms may include:  Discharge from the breasts.  Hot flashes.  Adult acne.  Facial or chest hair.  Headaches.  Impaired vision.  Recent stress.  Changes in weight, diet, or exercise patterns. DIAGNOSIS  Primary amenorrhea is diagnosed with the help of a medical history and a physical exam. Other tests that may be recommended include:  Blood tests to check for pregnancy, hormonal changes, a bleeding or thyroid disorder, low iron levels (anemia), or other problems.  Urine tests.  Specialized X-ray exams. TREATMENT  Treatment will depend on the cause. For example, some of the causes of primary amenorrhea, such as congenital absence of sex organs, will require surgery to correct. Others may respond to treatment with medicine. SEEK MEDICAL CARE IF:  There has not been any menstrual flow by age 3 years.  Body maturation does not occur at a level typical of peers.  Pelvic area pain occurs.  There is unusual weight gain or hair growth.   This information is not intended to replace advice given to you by your health care provider. Make sure you discuss any questions you have with your health care provider.   Document Released: 05/04/2005 Document Revised: 05/25/2014 Document Reviewed: 12/14/2012 Elsevier Interactive Patient Education [employer].

## 2015-10-29 NOTE — ED Notes (Signed)
States she was seen yesterday with wrist pain and urinary freq/pain  conts to have same sx's

## 2015-10-29 NOTE — ED Provider Notes (Signed)
[facility] Emergency Department Provider Note    ____________________________________________  Time seen: Approximately 6:02 PM  I have reviewed the triage vital signs and the nursing notes.   HISTORY  Chief Complaint Urinary Frequency    HPI Megan Benson is a 25 y.o. female who presents to the emergency department with dysuria. On Sunday night she experienced urinary frequency, urgency, and burning with urination. She drank a lot of water and took an AZO and had relief of frequency and urgency on Sud[place]. She was seen in this ED yesterday (10/28/15) for an unrelated right wrist pain complaint. She expressed her worry over the persistent dysuria and the provider ordered a urinalysis. She states that no one told her the results or discussed them with her. She denies fever, chills, abdominal pain, nausea, vomiting, hematuria, or vaginal itch, discharge or odor. She is currently sexually active with one monogamous partner, she does not use any form of birth control or protection. She states that she has irregular periods every 3 months and her last menstrual period was in May 2017.   History reviewed. No pertinent past medical history.  There are no active problems to display for this patient.   Past Surgical History  Procedure Laterality Date  . Ectopic pregnancy surgery      Current Outpatient Rx  Name  Route  Sig  Dispense  Refill  . aspirin-acetaminophen-caffeine (PAMPRIN MAX) 250-250-65 MG tablet   Oral   Take 1 tablet by mouth every 8 (eight) hours as needed for headache.   30 tablet   0   . meloxicam (MOBIC) 15 MG tablet   Oral   Take 1 tablet (15 mg total) by mouth daily.   30 tablet   0   . phenazopyridine (PYRIDIUM) 200 MG tablet   Oral   Take 1 tablet (200 mg total) by mouth 3 (three) times daily as needed for pain.   6 tablet   0     Allergies Review of patient's allergies indicates no known allergies.  No family  history on file.  Social History Social History  Substance Use Topics  . Smoking status: Never Smoker   . Smokeless tobacco: None  . Alcohol Use: No    Review of Systems Constitutional: No fever/chills Eyes: No visual changes. ENT: No sore throat. Cardiovascular: Denies chest pain. Respiratory: Denies shortness of breath. Gastrointestinal: No abdominal pain.  No nausea, no vomiting.  No diarrhea.  No constipation. Genitourinary: Positive for dysuria  Musculoskeletal: Right wrist pain Skin: Negative for rash. Neurological: Negative for headaches, focal weakness or numbness.   ____________________________________________   PHYSICAL EXAM:  VITAL SIGNS: ED Triage Vitals  Enc Vitals Group     BP 10/29/15 1730 152/89 mmHg     Pulse Rate 10/29/15 1730 79     Resp 10/29/15 1730 20     Temp 10/29/15 1730 98.6 F (37 C)     Temp Source 10/29/15 1730 Oral     SpO2 10/29/15 1730 100 %     Weight 10/29/15 1730 236 lb (107.049 kg)     Height 10/29/15 1730 5' (1.524 m)     Head Cir --      Peak Flow --      Pain Score 10/29/15 1719 0     Pain Loc --      Pain Edu? --      Excl. in [place]? --     Constitutional: Alert and oriented. Well appearing and in no  acute distress.Morbid obesity Eyes: Conjunctivae are normal. PERRL. EOMI. Head: Atraumatic. Nose: No congestion/rhinnorhea. Mouth/Throat: Mucous membranes are moist.  Oropharynx non-erythematous. Neck: No stridor.  No cervical spine tenderness to palpation. Hematological/Lymphatic/Immunilogical: No cervical lymphadenopathy. Cardiovascular: Normal rate, regular rhythm. Grossly normal heart sounds.  Good peripheral circulation. Respiratory: Normal respiratory effort.  No retractions. Lungs CTAB. Gastrointestinal: Soft and nontender. No distention. No abdominal bruits. No CVA tenderness. Genitourinary: Exam deferred Musculoskeletal: Patient wearing a wrist splint provided from her visit yesterday on the right upper  extremity. Neurologic:  Normal speech and language. No gross focal neurologic deficits are appreciated. No gait instability. Skin:  Skin is warm, dry and intact. No rash noted. Psychiatric: Mood and affect are normal. Speech and behavior are normal.  ____________________________________________   LABS (all labs ordered are listed, but only abnormal results are displayed)  Labs Reviewed  POC URINE PREG, ED  POCT PREGNANCY, URINE   ____________________________________________  EKG   ____________________________________________  RADIOLOGY   ____________________________________________   PROCEDURES  Procedure(s) performed: None  Critical Care performed: No  ____________________________________________   INITIAL IMPRESSION / ASSESSMENT AND PLAN / ED COURSE  Pertinent labs & imaging results that were available during my care of the patient were reviewed by me and considered in my medical decision making (see chart for details).  Dysuria and amenorrhea. Discuss lab results today include negative urine hCG with patient. Patient will follow-up with OB/GYN by calling for an appointment tomorrow morning. Patient given prescription for Pyridium. ____________________________________________   FINAL CLINICAL IMPRESSION(S) / ED DIAGNOSES  Final diagnoses:  Dysuria  Amenorrhea      NEW MEDICATIONS STARTED DURING THIS VISIT:  New Prescriptions   PHENAZOPYRIDINE (PYRIDIUM) 200 MG TABLET    Take 1 tablet (200 mg total) by mouth 3 (three) times daily as needed for pain.     Note:  This document was prepared using Dragon voice recognition software and may include unintentional dictation errors.    Sable Feil, PA-C 10/29/15 1841  Carrie Mew, MD 10/29/15 [phone]

## 2015-10-29 NOTE — ED Notes (Signed)
Pt states she was seen here yesterday with UTI sx and was not given the results and medical records is closed so she is wanting to be rechecked because she still has the sx.

## 2015-11-27 ENCOUNTER — Encounter: Payer: Self-pay | Admitting: *Deleted

## 2015-11-27 ENCOUNTER — Emergency Department
Admission: EM | Admit: 2015-11-27 | Discharge: 2015-11-27 | Disposition: A | Discharge status: Home / Self Care | Payer: 59 | Attending: Emergency Medicine | Admitting: Emergency Medicine

## 2015-11-27 DIAGNOSIS — Z7982 Long term (current) use of aspirin: Secondary | ICD-10-CM | POA: Insufficient documentation

## 2015-11-27 DIAGNOSIS — M79641 Pain in right hand: Secondary | ICD-10-CM | POA: Insufficient documentation

## 2015-11-27 DIAGNOSIS — Z79899 Other long term (current) drug therapy: Secondary | ICD-10-CM | POA: Diagnosis not present

## 2015-11-27 DIAGNOSIS — Z760 Encounter for issue of repeat prescription: Secondary | ICD-10-CM | POA: Insufficient documentation

## 2015-11-27 MED ORDER — MELOXICAM 15 MG PO TABS: 15.0000 mg | ORAL_TABLET | Freq: Every day | ORAL | Status: DC

## 2015-11-27 NOTE — ED Notes (Signed)
Patient states she is here for a medication refill of her meloxicam. States she was seen about a month ago for tendinitis of right arm. States she has numbness each morning with waking up to right hand denies numbness at this time. States she has an appointment with [facility] on the 30th.

## 2015-11-27 NOTE — Discharge Instructions (Signed)
Medicine Refill at the Emergency Department We have refilled your medicine today, but it is best for you to get refills through your primary health care provider's office. In the future, please plan ahead so you do not need to get refills from the emergency department. If the medicine we refilled was a maintenance medicine, you may have received only enough to get you by until you are able to see your regular health care provider.   This information is not intended to replace advice given to you by your health care provider. Make sure you discuss any questions you have with your health care provider.   Document Released: 08/21/2003 Document Revised: 05/25/2014 Document Reviewed: 08/11/2013 Elsevier Interactive Patient Education 2016 [employer].    Keep appointment with your doctor. Meloxicam once daily with food.

## 2015-11-27 NOTE — ED Notes (Signed)
Pt is here for refill of mobic for hand pain

## 2015-11-27 NOTE — ED Provider Notes (Signed)
[facility] Emergency Department Provider Note  ____________________________________________  Time seen: Approximately 12:23 PM  I have reviewed the triage vital signs and the nursing notes.   HISTORY  Chief Complaint Medication Refill   HPI Megan Benson is a 25 y.o. female is here for refill of her meloxicam. Patient was staying in the emergency room approximately one month ago and diagnosed with tendinitis of her right arm. She states that as long as she is taking the meloxicam she has no symptoms. She does have an appointment with [facility] on July 30. This is a new appointment for her and until then she cannot get a refill on her medication.   History reviewed. No pertinent past medical history.  There are no active problems to display for this patient.   Past Surgical History  Procedure Laterality Date  . Ectopic pregnancy surgery      Current Outpatient Rx  Name  Route  Sig  Dispense  Refill  . aspirin-acetaminophen-caffeine (PAMPRIN MAX) 250-250-65 MG tablet   Oral   Take 1 tablet by mouth every 8 (eight) hours as needed for headache.   30 tablet   0   . meloxicam (MOBIC) 15 MG tablet   Oral   Take 1 tablet (15 mg total) by mouth daily.   30 tablet   0   . phenazopyridine (PYRIDIUM) 200 MG tablet   Oral   Take 1 tablet (200 mg total) by mouth 3 (three) times daily as needed for pain.   6 tablet   0     Allergies Review of patient's allergies indicates no known allergies.  No family history on file.  Social History Social History  Substance Use Topics  . Smoking status: Never Smoker   . Smokeless tobacco: None  . Alcohol Use: No    Review of Systems Constitutional: No fever/chills Cardiovascular: Denies chest pain. Respiratory: Denies shortness of breath. Gastrointestinal:  No nausea, no vomiting.  Musculoskeletal: Pain right hand positive. Skin: Negative for rash. Neurological: Negative for  headaches, focal weakness or numbness.  10-point ROS otherwise negative.  ____________________________________________   PHYSICAL EXAM:  VITAL SIGNS: ED Triage Vitals  Enc Vitals Group     BP 11/27/15 1210 129/85 mmHg     Pulse Rate 11/27/15 1210 72     Resp --      Temp 11/27/15 1210 98.5 F (36.9 C)     Temp Source 11/27/15 1210 Oral     SpO2 11/27/15 1210 98 %     Weight 11/27/15 1210 230 lb (104.327 kg)     Height 11/27/15 1210 5' (1.524 m)     Head Cir --      Peak Flow --      Pain Score --      Pain Loc --      Pain Edu? --      Excl. in [place]? --     Constitutional: Alert and oriented. Well appearing and in no acute distress. Eyes: Conjunctivae are normal. PERRL. EOMI. Head: Atraumatic. Nose: No congestion/rhinnorhea. Neck: No stridor.   Cardiovascular: Normal rate, regular rhythm. Grossly normal heart sounds.  Good peripheral circulation. Respiratory: Normal respiratory effort.  No retractions. Lungs CTAB. Musculoskeletal:Examination of the right hand there is no gross deformity. Patient is able move all digits without any difficulty and is able make a complete fist without any restriction. Motor sensory function intact. Capillary refill less than 3 seconds. There is no skin discoloration or warmth  to the area. Neurologic:  Normal speech and language. No gross focal neurologic deficits are appreciated. No gait instability. Skin:  Skin is warm, dry and intact. No rash noted. No ecchymosis, abrasions or erythema was noted. Psychiatric: Mood and affect are normal. Speech and behavior are normal.  ____________________________________________   LABS (all labs ordered are listed, but only abnormal results are displayed)  Labs Reviewed - No data to display  PROCEDURES  Procedure(s) performed: None  Procedures  Critical Care performed: No  ____________________________________________   INITIAL IMPRESSION / ASSESSMENT AND PLAN / ED COURSE  Pertinent labs &  imaging results that were available during my care of the patient were reviewed by me and considered in my medical decision making (see chart for details).  Patient was given a prescription for meloxicam 50 mg one daily with food. She is encouraged to keep her appointment with [facility] on July 30. ____________________________________________   FINAL CLINICAL IMPRESSION(S) / ED DIAGNOSES  Final diagnoses:  Encounter for medication refill      NEW MEDICATIONS STARTED DURING THIS VISIT:  Discharge Medication List as of 11/27/2015 12:45 PM       Note:  This document was prepared using Dragon voice recognition software and may include unintentional dictation errors.    Johnn Hai, PA-C 11/27/15 1346  Earleen Newport, MD 11/27/15 1400

## 2016-02-15 ENCOUNTER — Encounter: Payer: Self-pay | Admitting: Emergency Medicine

## 2016-02-15 ENCOUNTER — Emergency Department
Admission: EM | Admit: 2016-02-15 | Discharge: 2016-02-15 | Disposition: A | Discharge status: Home / Self Care | Payer: 59 | Attending: Student | Admitting: Student

## 2016-02-15 DIAGNOSIS — L089 Local infection of the skin and subcutaneous tissue, unspecified: Secondary | ICD-10-CM | POA: Diagnosis not present

## 2016-02-15 DIAGNOSIS — S20362A Insect bite (nonvenomous) of left front wall of thorax, initial encounter: Secondary | ICD-10-CM | POA: Insufficient documentation

## 2016-02-15 DIAGNOSIS — Y939 Activity, unspecified: Secondary | ICD-10-CM | POA: Diagnosis not present

## 2016-02-15 DIAGNOSIS — W57XXXA Bitten or stung by nonvenomous insect and other nonvenomous arthropods, initial encounter: Secondary | ICD-10-CM | POA: Diagnosis not present

## 2016-02-15 DIAGNOSIS — Y929 Unspecified place or not applicable: Secondary | ICD-10-CM | POA: Diagnosis not present

## 2016-02-15 DIAGNOSIS — Y999 Unspecified external cause status: Secondary | ICD-10-CM | POA: Diagnosis not present

## 2016-02-15 MED ORDER — HYDROCODONE-ACETAMINOPHEN 5-325 MG PO TABS: 1.0000 | ORAL_TABLET | ORAL | 0 refills | Status: DC | PRN

## 2016-02-15 MED ORDER — SULFAMETHOXAZOLE-TRIMETHOPRIM 800-160 MG PO TABS: 1.0000 | ORAL_TABLET | Freq: Two times a day (BID) | ORAL | 0 refills | Status: DC

## 2016-02-15 MED ORDER — NAPROXEN 500 MG PO TABS: 500.0000 mg | ORAL_TABLET | Freq: Two times a day (BID) | ORAL | 0 refills | Status: DC

## 2016-02-15 NOTE — ED Provider Notes (Signed)
[facility] Emergency Department Provider Note   ____________________________________________   None    (approximate)  I have reviewed the triage vital signs and the nursing notes.   HISTORY  Chief Complaint Insect Bite    HPI Megan Benson is a 25 y.o. female presents for evaluation insect bite to her left lateral rib cage near the bra line. Patient states that symptoms started yesterday and progressively gotten worse. Complains of itching and redness.   History reviewed. No pertinent past medical history.  There are no active problems to display for this patient.   Past Surgical History:  Procedure Laterality Date  . ECTOPIC PREGNANCY SURGERY      Prior to Admission medications   Medication Sig Start Date End Date Taking? Authorizing Provider  HYDROcodone-acetaminophen (NORCO) 5-325 MG tablet Take 1-2 tablets by mouth every 4 (four) hours as needed for moderate pain. 02/15/16   Arlyss Repress, PA-C  naproxen (NAPROSYN) 500 MG tablet Take 1 tablet (500 mg total) by mouth 2 (two) times daily with a meal. 02/15/16   Pierce Crane Lainee Lehrman, PA-C  sulfamethoxazole-trimethoprim (BACTRIM DS,SEPTRA DS) 800-160 MG tablet Take 1 tablet by mouth 2 (two) times daily. 02/15/16   Arlyss Repress, PA-C    Allergies Review of patient's allergies indicates no known allergies.  No family history on file.  Social History Social History  Substance Use Topics  . Smoking status: Never Smoker  . Smokeless tobacco: Not on file  . Alcohol use No    Review of Systems Constitutional: No fever/chills Cardiovascular: Denies chest pain. Respiratory: Denies shortness of breath. Skin:Positive for rash. Erythema. No fluctuance nontender area with obvious insect bite. Neurological: Negative for headaches, focal weakness or numbness.  10-point ROS otherwise negative.  ____________________________________________   PHYSICAL EXAM:  VITAL SIGNS: ED Triage Vitals   Enc Vitals Group     BP 02/15/16 1102 126/66     Pulse Rate 02/15/16 1102 78     Resp 02/15/16 1102 18     Temp 02/15/16 1102 98.2 F (36.8 C)     Temp Source 02/15/16 1102 Oral     SpO2 02/15/16 1102 98 %     Weight 02/15/16 1101 236 lb (107 kg)     Height 02/15/16 1101 5\' 4"  (1.626 m)     Head Circumference --      Peak Flow --      Pain Score 02/15/16 1101 10     Pain Loc --      Pain Edu? --      Excl. in [place]? --     Constitutional: Alert and oriented. Well appearing and in no acute distress.  Cardiovascular: Normal rate, regular rhythm. Grossly normal heart sounds.  Good peripheral circulation. Respiratory: Normal respiratory effort.  No retractions. Lungs CTAB. Gastrointestinal: Soft and nontender. No distention. No abdominal bruits. No CVA tenderness. Musculoskeletal: No lower extremity tenderness nor edema.  No joint effusions. Neurologic:  Normal speech and language. No gross focal neurologic deficits are appreciated. No gait instability. Skin:  Skin is warm, dry and intact. 2-3 area of erythema noted with nontender mild pustular lesion noted with a punctate wound mid center left lateral rib cage. Psychiatric: Mood and affect are normal. Speech and behavior are normal.  ____________________________________________   LABS (all labs ordered are listed, but only abnormal results are displayed)  Labs Reviewed - No data to display ____________________________________________  EKG   ____________________________________________  RADIOLOGY   ____________________________________________   PROCEDURES  Procedure(s) performed: None  Procedures  Critical Care performed: No  ____________________________________________   INITIAL IMPRESSION / ASSESSMENT AND PLAN / ED COURSE  Pertinent labs & imaging results that were available during my care of the patient were reviewed by me and considered in my medical decision making (see chart for details).  Insect bite  with surrounding early cellulitis. Rx given for Bactrim DS twice a day, Naprosyn and #8 Vicodin for acute pain only. Patient follow-up with her PCP and return to ER with any worsening symptomology.  Clinical Course     ____________________________________________   FINAL CLINICAL IMPRESSION(S) / ED DIAGNOSES  Final diagnoses:  Infected insect bite      NEW MEDICATIONS STARTED DURING THIS VISIT:  Discharge Medication List as of 02/15/2016 11:23 AM    START taking these medications   Details  HYDROcodone-acetaminophen (NORCO) 5-325 MG tablet Take 1-2 tablets by mouth every 4 (four) hours as needed for moderate pain., Starting Sat 02/15/2016, Print    naproxen (NAPROSYN) 500 MG tablet Take 1 tablet (500 mg total) by mouth 2 (two) times daily with a meal., Starting Sat 02/15/2016, Print    sulfamethoxazole-trimethoprim (BACTRIM DS,SEPTRA DS) 800-160 MG tablet Take 1 tablet by mouth 2 (two) times daily., Starting Sat 02/15/2016, Print         Note:  This document was prepared using Dragon voice recognition software and may include unintentional dictation errors.   Arlyss Repress, PA-C 02/15/16 1214    Joanne Gavel, MD 02/15/16 [phone]

## 2016-02-15 NOTE — ED Notes (Signed)
See triage note  Possible insect bite    Area red and tender to touch

## 2016-02-15 NOTE — Discharge Instructions (Signed)
Use warm compresses as needed. Do not squeeze or mash the area.

## 2016-02-15 NOTE — ED Triage Notes (Signed)
Pt states she noticed bite to left side near bra line yesterday that has increased in size, unsure what kind of insect bit her.  No drainage noticed.

## 2016-04-14 IMAGING — US US TRANSVAGINAL NON-OB
1 series · 14 of 25 positions shown · non-contrast
Comparison: None

CLINICAL DATA: Left lower quadrant pain for 1 week.

EXAM:
TRANSABDOMINAL AND TRANSVAGINAL ULTRASOUND OF PELVIS
TECHNIQUE: Both transabdominal and transvaginal ultrasound examinations of the
pelvis were performed. Transabdominal technique was performed for
global imaging of the pelvis including uterus, ovaries, adnexal
regions, and pelvic cul-de-sac. It was necessary to proceed with
endovaginal exam following the transabdominal exam to visualize the
endometrium and right ovary.

[Series 1: us transvaginal non-ob · 0.21mm/px · 14 of 96 slices shown]
[im 1/96]
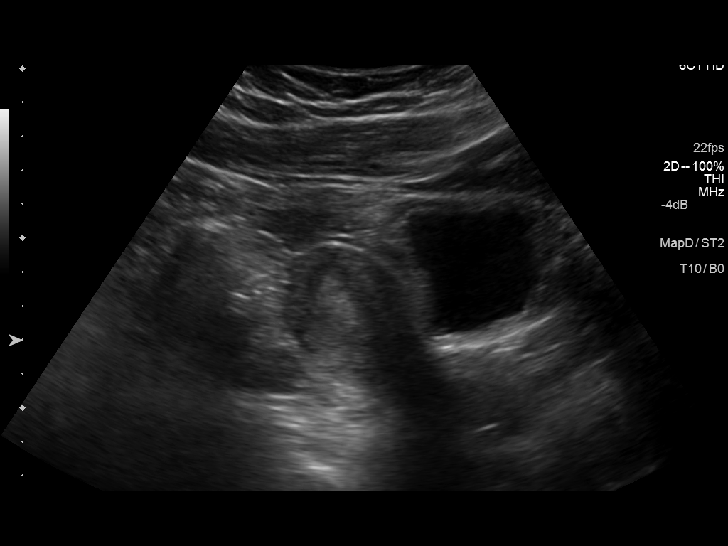
[im 8/96]
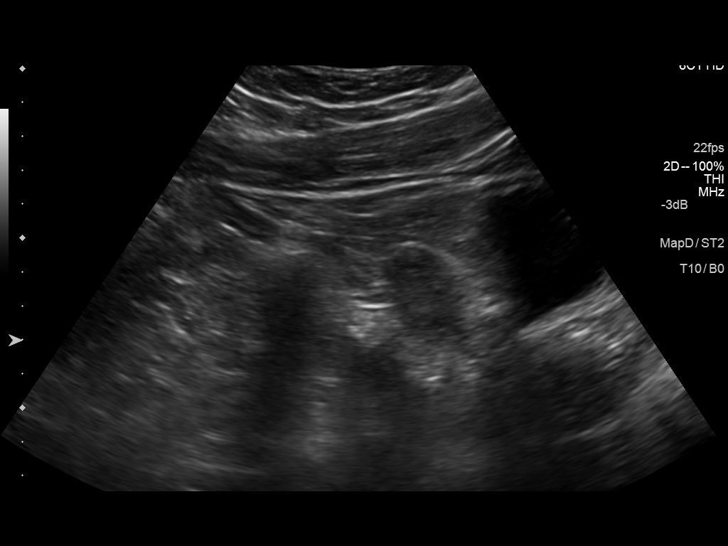
[im 16/96]
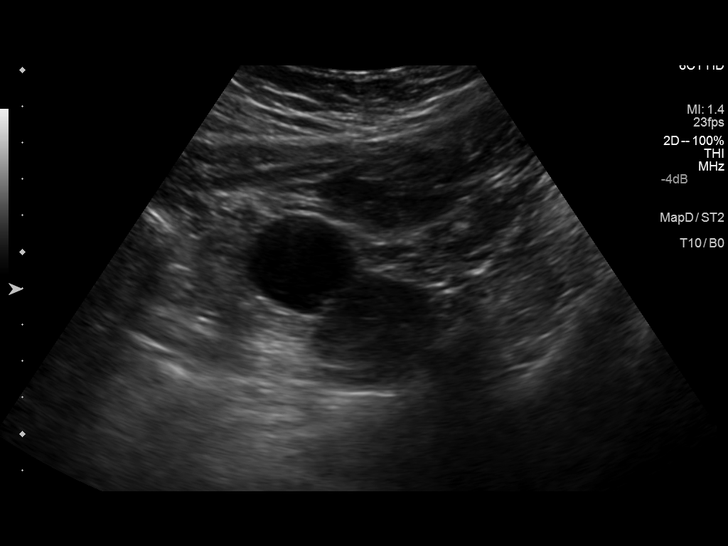
[im 24/96]
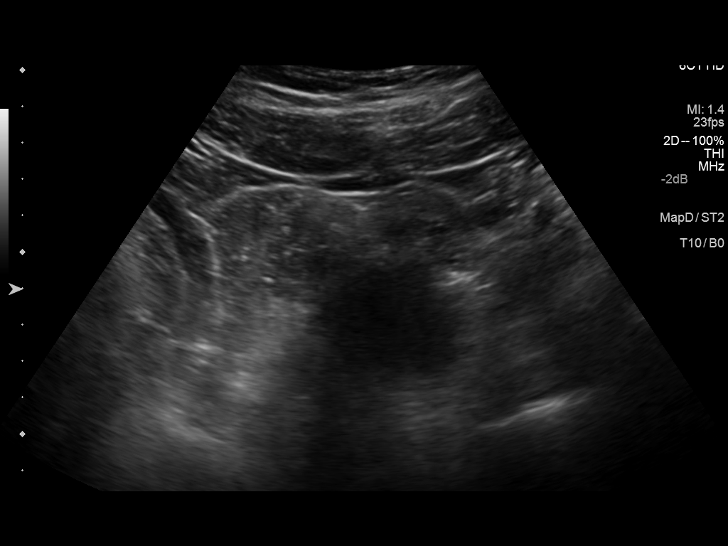
[im 32/96]
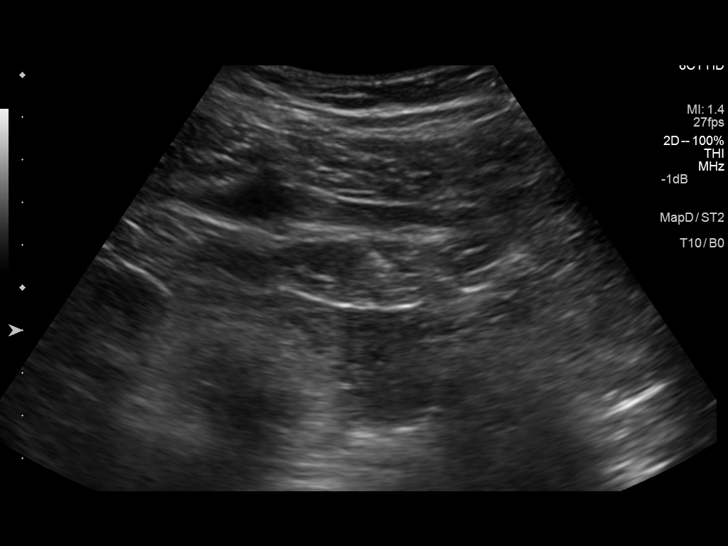
[im 36/96]
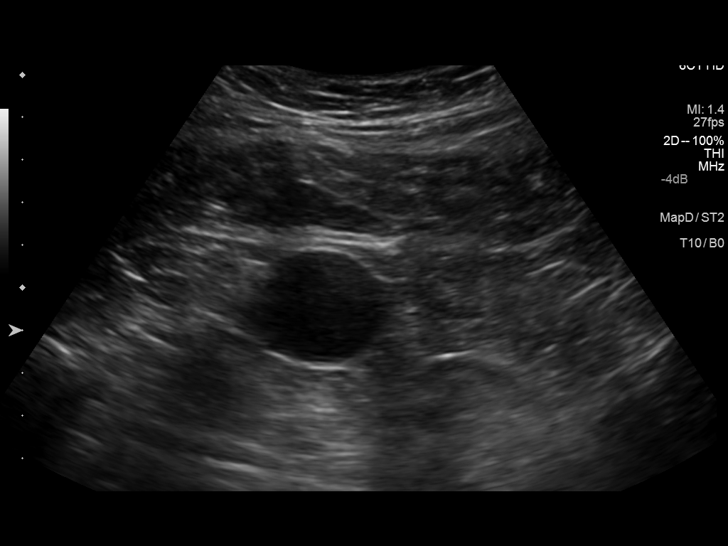
[im 44/96]
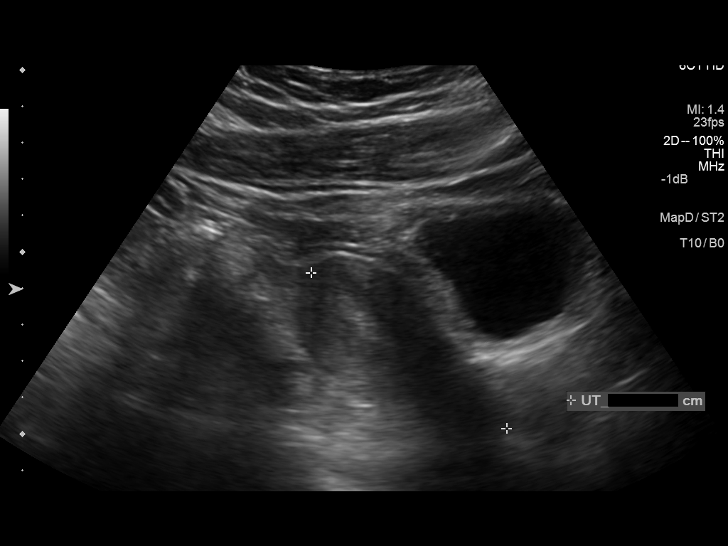
[im 52/96]
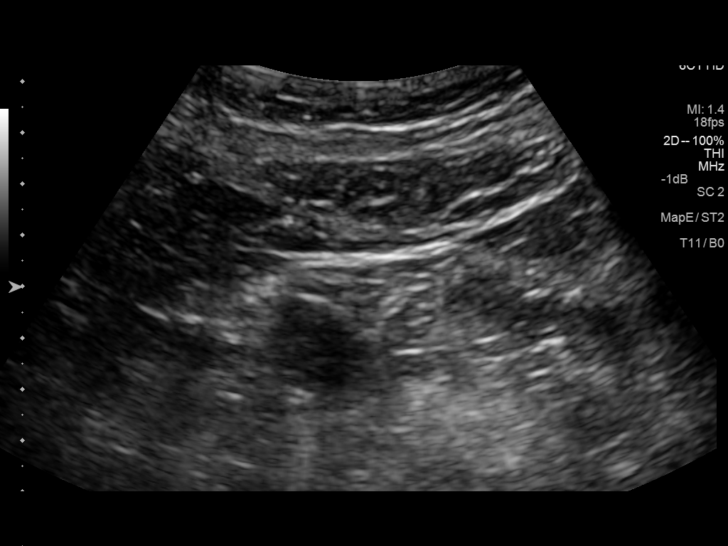
[im 60/96]
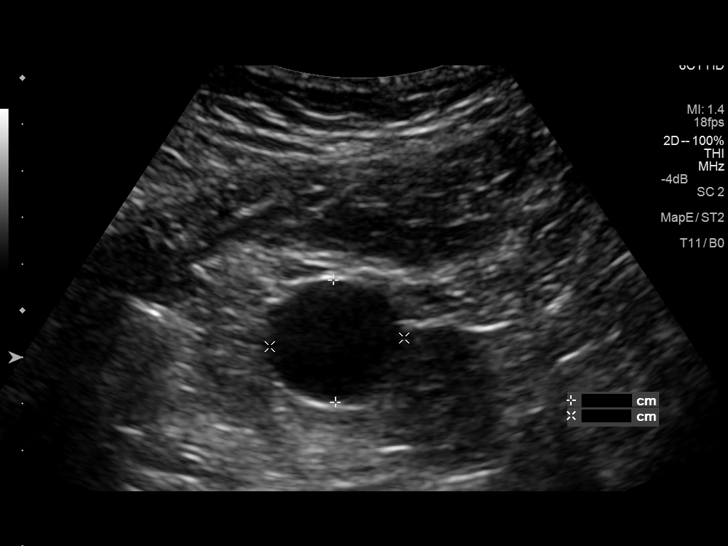
[im 64/96]
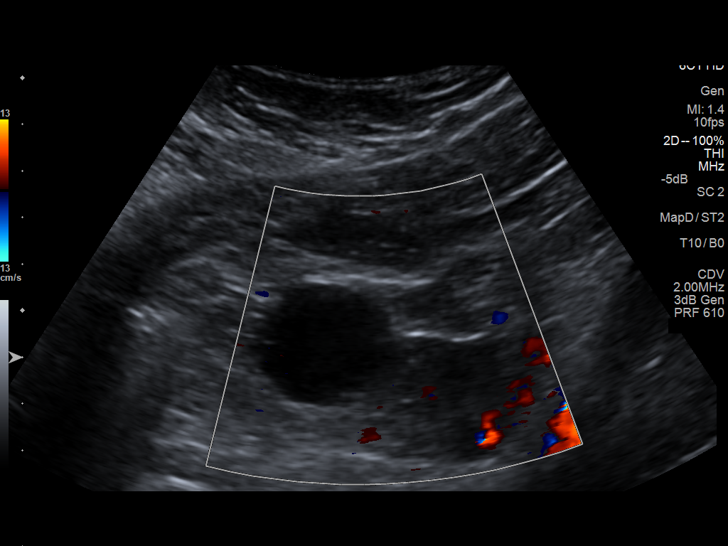
[im 72/96]
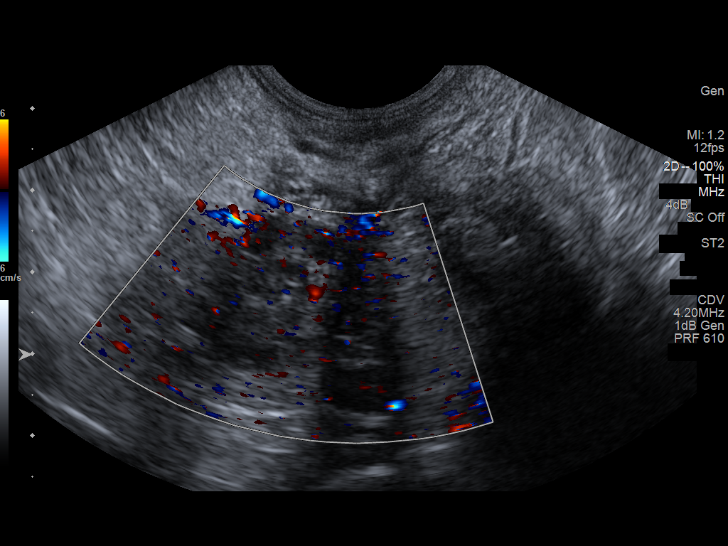
[im 80/96]
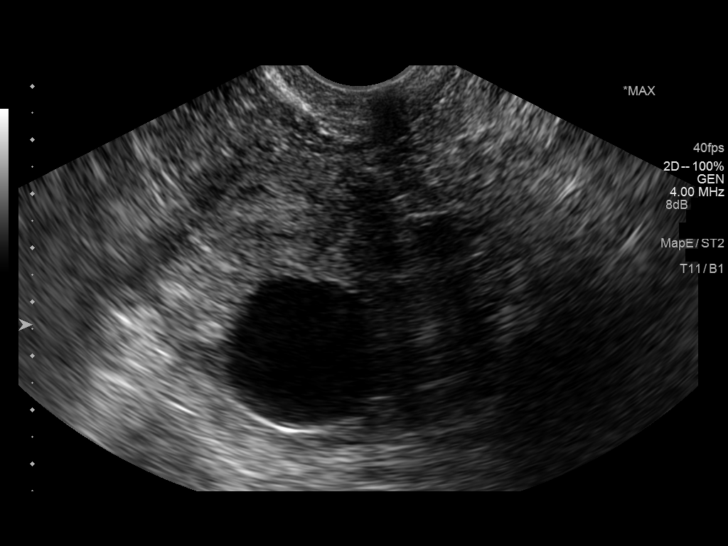
[im 88/96]
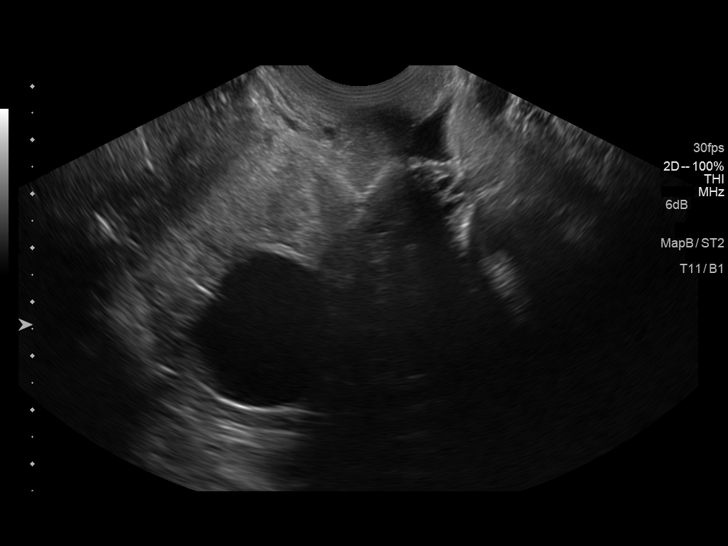
[im 96/96]
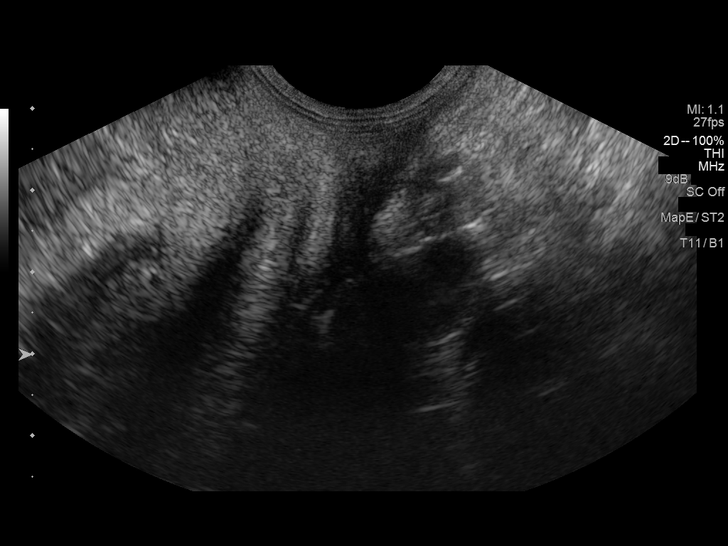

[14 of 25 positions shown; findings below may reference images not displayed]

FINDINGS: Uterus

Measurements: 6.8 x 3.0 x 3.8 cm. No fibroids or other mass
visualized.

Endometrium

Thickness: 8.0 mm.  No focal abnormality visualized.

Right ovary

Measurements: 2.4 x 2.0 x 1.9 cm. Normal appearance/no adnexal mass.

Left ovary

Measurements: 4.4 x 2.9 x 2.4 cm. There is a simple unilocular cyst
measuring 2.9 cm

Other findings

No free fluid.
IMPRESSION: Simple unilocular 2.9 cm left ovarian cyst. Normal uterus and right
ovary.

## 2016-06-08 ENCOUNTER — Emergency Department: Payer: Self-pay

## 2016-06-08 ENCOUNTER — Encounter: Payer: Self-pay | Admitting: Emergency Medicine

## 2016-06-08 ENCOUNTER — Emergency Department
Admission: EM | Admit: 2016-06-08 | Discharge: 2016-06-08 | Disposition: A | Discharge status: Home / Self Care | Payer: Self-pay | Attending: Emergency Medicine | Admitting: Emergency Medicine

## 2016-06-08 DIAGNOSIS — N838 Other noninflammatory disorders of ovary, fallopian tube and broad ligament: Secondary | ICD-10-CM

## 2016-06-08 DIAGNOSIS — R1031 Right lower quadrant pain: Secondary | ICD-10-CM

## 2016-06-08 DIAGNOSIS — D259 Leiomyoma of uterus, unspecified: Secondary | ICD-10-CM | POA: Insufficient documentation

## 2016-06-08 DIAGNOSIS — N839 Noninflammatory disorder of ovary, fallopian tube and broad ligament, unspecified: Secondary | ICD-10-CM | POA: Insufficient documentation

## 2016-06-08 DIAGNOSIS — N760 Acute vaginitis: Secondary | ICD-10-CM | POA: Insufficient documentation

## 2016-06-08 DIAGNOSIS — R109 Unspecified abdominal pain: Secondary | ICD-10-CM

## 2016-06-08 DIAGNOSIS — B9689 Other specified bacterial agents as the cause of diseases classified elsewhere: Secondary | ICD-10-CM

## 2016-06-08 LAB — CBC
HCT: 38.4 % (ref 35.0–47.0)
Hemoglobin: 12.8 g/dL (ref 12.0–16.0)
MCH: 27.1 pg (ref 26.0–34.0)
MCHC: 33.3 g/dL (ref 32.0–36.0)
MCV: 81.4 fL (ref 80.0–100.0)
PLATELETS: 273 10*3/uL (ref 150–440)
RBC: 4.72 MIL/uL (ref 3.80–5.20)
RDW: 13.2 % (ref 11.5–14.5)
WBC: 12.9 10*3/uL — AB (ref 3.6–11.0)

## 2016-06-08 LAB — URINALYSIS, COMPLETE (UACMP) WITH MICROSCOPIC
Bacteria, UA: NONE SEEN
Bilirubin Urine: NEGATIVE
GLUCOSE, UA: NEGATIVE mg/dL
KETONES UR: NEGATIVE mg/dL
Leukocytes, UA: NEGATIVE
Nitrite: NEGATIVE
PH: 6 (ref 5.0–8.0)
Protein, ur: NEGATIVE mg/dL
SPECIFIC GRAVITY, URINE: 1.024 (ref 1.005–1.030)

## 2016-06-08 LAB — COMPREHENSIVE METABOLIC PANEL
ALK PHOS: 57 U/L (ref 38–126)
ALT: 20 U/L (ref 14–54)
AST: 23 U/L (ref 15–41)
Albumin: 3.9 g/dL (ref 3.5–5.0)
Anion gap: 6 (ref 5–15)
BUN: 15 mg/dL (ref 6–20)
CALCIUM: 9 mg/dL (ref 8.9–10.3)
CO2: 24 mmol/L (ref 22–32)
CREATININE: 0.9 mg/dL (ref 0.44–1.00)
Chloride: 108 mmol/L (ref 101–111)
Glucose, Bld: 132 mg/dL — ABNORMAL HIGH (ref 65–99)
Potassium: 4 mmol/L (ref 3.5–5.1)
Sodium: 138 mmol/L (ref 135–145)
Total Bilirubin: 0.4 mg/dL (ref 0.3–1.2)
Total Protein: 7.1 g/dL (ref 6.5–8.1)

## 2016-06-08 LAB — WET PREP, GENITAL
Sperm: NONE SEEN
TRICH WET PREP: NONE SEEN
YEAST WET PREP: NONE SEEN

## 2016-06-08 LAB — CHLAMYDIA/NGC RT PCR (ARMC ONLY)
CHLAMYDIA TR: NOT DETECTED
N GONORRHOEAE: NOT DETECTED

## 2016-06-08 LAB — PREGNANCY, URINE: Preg Test, Ur: NEGATIVE

## 2016-06-08 LAB — LIPASE, BLOOD: Lipase: 27 U/L (ref 11–51)

## 2016-06-08 MED ORDER — SODIUM CHLORIDE 0.9 % IV BOLUS (SEPSIS)
1000.0000 mL | Freq: Once | INTRAVENOUS | Status: AC
  Administered 2016-06-08: 1000 mL via INTRAVENOUS

## 2016-06-08 MED ORDER — HYDROMORPHONE HCL 1 MG/ML IJ SOLN
0.5000 mg | Freq: Once | INTRAMUSCULAR | Status: AC
  Administered 2016-06-08: 0.5 mg via INTRAVENOUS
  Filled 2016-06-08: qty 1

## 2016-06-08 MED ORDER — IBUPROFEN 400 MG PO TABS
ORAL_TABLET | ORAL | Status: AC
  Filled 2016-06-08: qty 1

## 2016-06-08 MED ORDER — IBUPROFEN 800 MG PO TABS: 800.0000 mg | ORAL_TABLET | Freq: Three times a day (TID) | ORAL | 0 refills | Status: DC | PRN

## 2016-06-08 MED ORDER — METRONIDAZOLE 500 MG PO TABS: 500.0000 mg | ORAL_TABLET | Freq: Two times a day (BID) | ORAL | 0 refills | Status: DC

## 2016-06-08 MED ORDER — KETOROLAC TROMETHAMINE 30 MG/ML IJ SOLN
30.0000 mg | Freq: Once | INTRAMUSCULAR | Status: AC
  Administered 2016-06-08: 30 mg via INTRAVENOUS
  Filled 2016-06-08: qty 1

## 2016-06-08 MED ORDER — METRONIDAZOLE 500 MG PO TABS
500.0000 mg | ORAL_TABLET | Freq: Once | ORAL | Status: AC
  Administered 2016-06-08: 500 mg via ORAL
  Filled 2016-06-08: qty 1

## 2016-06-08 MED ORDER — IBUPROFEN 400 MG PO TABS
400.0000 mg | ORAL_TABLET | Freq: Once | ORAL | Status: AC | PRN
  Administered 2016-06-08: 400 mg via ORAL

## 2016-06-08 MED ORDER — ONDANSETRON HCL 4 MG/2ML IJ SOLN
4.0000 mg | Freq: Once | INTRAMUSCULAR | Status: AC
  Administered 2016-06-08: 4 mg via INTRAVENOUS
  Filled 2016-06-08: qty 2

## 2016-06-08 NOTE — Discharge Instructions (Signed)
Please make a follow-up appointment with Dr. Leafy Ro, the gynecologist. She will follow-up the mass that was found on your right ovary.  Please take the entire course of antibiotics, even if you're feeling better. Please have your partner tested and treated for any possible STDs before you resume sexual relations.  Return to the emergency department if you develop severe pain, nausea or vomiting, fever, or any other symptoms concerning to you.

## 2016-06-08 NOTE — ED Notes (Signed)
Received report from [place] rn, care assumed.  Pt resting in bed.

## 2016-06-08 NOTE — ED Triage Notes (Signed)
Patient with lower abdominal cramping that stared around 21:30 last night. Denies any N/V/D or pain with urination.

## 2016-06-08 NOTE — ED Provider Notes (Signed)
[facility] Emergency Department Provider Note  ____________________________________________  Time seen: Approximately 7:44 AM  I have reviewed the triage vital signs and the nursing notes.   HISTORY  Chief Complaint Abdominal Pain    HPI Megan Benson is a 26 y.o. female , nonpregnant, with morbid obesity and history of ectopic pregnancy presenting with right lower quadrant pain. The patient reports that around 9:30, she was at a friend's house when she developed a severe pain in the right side and right lower quadrant "like a really bad cramp." She tried urinating, an ice pack, and a heating pad, and while the pain improved, it did not go away. She denies any associated nausea or vomiting, diarrhea, constipation, dysuria or urinary frequency, or change in vaginal discharge. She is sexually active. She continues to have discomfort at this time.   History reviewed. No pertinent past medical history.  There are no active problems to display for this patient.   Past Surgical History:  Procedure Laterality Date  . ECTOPIC PREGNANCY SURGERY      Current Outpatient Rx  . Order #: [id number] Class: Print  . Order #: [id number] Class: Print  . Order #: [id number] Class: Print  . Order #: [id number] Class: Print    Allergies Patient has no known allergies.  No family history on file.  Social History Social History  Substance Use Topics  . Smoking status: Never Smoker  . Smokeless tobacco: Never Used  . Alcohol use No    Review of Systems Constitutional: No fever/chills.No lightheadedness or syncope. Eyes: No visual changes. ENT: No sore throat. No congestion or rhinorrhea. Cardiovascular: Denies chest pain. Denies palpitations. Respiratory: Denies shortness of breath.  No cough. Gastrointestinal: Positive right lower quadrant abdominal pain.  No nausea, no vomiting.  No diarrhea.  No constipation. Genitourinary: Negative for dysuria. Negative for  urinary frequency. Negative for change in vaginal discharge. Musculoskeletal: Negative for back pain. Skin: Negative for rash. Neurological: Negative for headaches. No focal numbness, tingling or weakness.  \ 10-point ROS otherwise negative.  ____________________________________________   PHYSICAL EXAM:  VITAL SIGNS: ED Triage Vitals [06/08/16 0356]  Enc Vitals Group     BP (!) 141/71     Pulse Rate (!) 105     Resp 18     Temp 99.2 F (37.3 C)     Temp Source Oral     SpO2 100 %     Weight 256 lb (116.1 kg)     Height 5\' 2"  (1.575 m)     Head Circumference      Peak Flow      Pain Score 10     Pain Loc      Pain Edu?      Excl. in [place]?     Constitutional: Alert and oriented. Mildly uncomfortable appearing but in no acute distress. Answers questions appropriately. Eyes: Conjunctivae are normal.  EOMI. No scleral icterus. Head: Atraumatic. Nose: No congestion/rhinnorhea. Mouth/Throat: Mucous membranes are moist.  Neck: No stridor.  Supple.   Cardiovascular: Normal rate, regular rhythm. No murmurs, rubs or gallops.  Respiratory: Normal respiratory effort.  No accessory muscle use or retractions. Lungs CTAB.  No wheezes, rales or ronchi. Gastrointestinal: Obese. Soft, and nondistended.  Tender to palpation in the right outer lower quadrant and right medial lower quadrant equally. No guarding or rebound.  No peritoneal signs. Genitourinary: Normal-appearing external genitalia without lesions. Normal vaginal exam with scant blood consistent with menses, physiologic discharge, normal-appearing cervix, normal vaginal wall tissue. Bimanual exam  is negative for CMTno palpable masses. The patient has minimal discomfort with palpation of the entire left lower quadrant, suprapubic, and right lower quadrant areas without focality. Musculoskeletal: No LE edema.  Neurologic:  A&Ox3.  Speech is clear.  Face and smile are symmetric.  EOMI.  Moves all extremities well. Skin:  Skin is warm,  dry and intact. No rash noted. Psychiatric: Mood and affect are normal. Speech and behavior are normal.  Normal judgement.  ____________________________________________   LABS (all labs ordered are listed, but only abnormal results are displayed)  Labs Reviewed  WET PREP, GENITAL - Abnormal; Notable for the following:       Result Value   Clue Cells Wet Prep HPF POC PRESENT (*)    WBC, Wet Prep HPF POC FEW (*)    All other components within normal limits  COMPREHENSIVE METABOLIC PANEL - Abnormal; Notable for the following:    Glucose, Bld 132 (*)    All other components within normal limits  CBC - Abnormal; Notable for the following:    WBC 12.9 (*)    All other components within normal limits  URINALYSIS, COMPLETE (UACMP) WITH MICROSCOPIC - Abnormal; Notable for the following:    Color, Urine YELLOW (*)    APPearance HAZY (*)    Hgb urine dipstick MODERATE (*)    Squamous Epithelial / LPF 0-5 (*)    All other components within normal limits  CHLAMYDIA/NGC RT PCR ([facility] ONLY)  LIPASE, BLOOD  PREGNANCY, URINE   ____________________________________________  EKG  Not indicated ____________________________________________  RADIOLOGY  US Transvaginal Non-ob  Result Date: 06/08/2016 CLINICAL DATA:  Right lower quadrant pain.  Prior right ectopic. EXAM: TRANSABDOMINAL AND TRANSVAGINAL ULTRASOUND OF PELVIS TECHNIQUE: Both transabdominal and transvaginal ultrasound examinations of the pelvis were performed. Transabdominal technique was performed for global imaging of the pelvis including uterus, ovaries, adnexal regions, and pelvic cul-de-sac. It was necessary to proceed with endovaginal exam following the transabdominal exam to visualize the uterus and endometrium. COMPARISON:  CT 06/08/2016. FINDINGS: Uterus Measurements: 9.6 x 3.0 x 3.9 cm. No fibroids or other mass visualized. Endometrium Thickness: 5.8 mm.  No focal abnormality visualized. Right ovary Measurements: 3.7 x  2.1 x 1.6 cm. Focal hyperechoic region is noted in the right ovary as noted on prior exam. This is most likely a small lipoma. A component of scarring may also be present . Left ovary Measurements: 4.0 x 2.5 x 2.3 cm. Normal appearance/no adnexal mass. Other findings No abnormal free fluid. IMPRESSION: No acute abnormality identified. Electronically Signed   By: Marcello Moores  Register   On: 06/08/2016 10:30   US Pelvis Complete  Result Date: 06/08/2016 CLINICAL DATA:  Right lower quadrant/pelvic pain. Patient has had previous surgical removal of the right fallopian tube due to ectopic gestation. EXAM: TRANSABDOMINAL AND TRANSVAGINAL ULTRASOUND OF PELVIS TECHNIQUE: Study was performed transabdominally to optimize pelvic field of view evaluation and transvaginally to optimize internal visceral architecture evaluation. COMPARISON:  None FINDINGS: Uterus Measurements: 6.5 x 3.0 x 3.9 cm. There is an area of mild decreased attenuation along the rightward aspect of the uterine fundus measuring 1.3 x 1.0 cm, a likely small leiomyoma. Uterus otherwise appears unremarkable. Endometrium Thickness: 6 mm.  No focal abnormality visualized. Right ovary Measurements: 3.7 x 2.1 x 1.6 cm. There is a uniformly hyperechoic structure in the right adnexum measuring 1.4 x 1.3 x 1.2 cm. Left ovary Measurements: 4.0 x 2.5 x 2.3 cm. Normal appearance/no adnexal mass. Other findings No abnormal free fluid. IMPRESSION:  Suspect small leiomyoma within the uterus. Hyperechoic structure in the right adnexal region with uniformly increased echogenicity. Question small dermoid in this area given what appears to be fatty tissue. It is also possible that this echogenic focus is due to fat deposition in previous scar from prior salpingectomy on the right. This area appears benign. Study otherwise unremarkable. Electronically Signed   By: Lowella Grip III M.D.   On: 06/08/2016 10:26   Ct Renal Stone Study  Result Date: 06/08/2016 CLINICAL DATA:   Right lower abdominal cramping. Elevated white blood cell count. EXAM: CT ABDOMEN AND PELVIS WITHOUT CONTRAST TECHNIQUE: Multidetector CT imaging of the abdomen and pelvis was performed following the standard protocol without IV contrast. COMPARISON:  None. FINDINGS: Lower chest: Lung bases are clear.  No pleural effusions. Hepatobiliary: Normal appearance of the liver and gallbladder. No significant biliary dilatation. Pancreas: Normal appearance of the pancreas without inflammation or duct dilatation. Spleen: Normal appearance of spleen without enlargement. Adrenals/Urinary Tract: Normal adrenal glands. Normal appearance of both kidneys without hydronephrosis or stones. Urinary bladder is unremarkable. Stomach/Bowel: Stomach is within normal limits. Appendix appears normal. No evidence of bowel wall thickening, distention, or inflammatory changes. Vascular/Lymphatic: No significant vascular findings are present. No enlarged abdominal or pelvic lymph nodes. Reproductive: Uterus and bilateral adnexa are unremarkable. Other: No ascites. No free air. Small umbilical hernia containing fat. Musculoskeletal: Negative IMPRESSION: No acute abnormality in the abdomen or pelvis. Specifically, negative for kidney stones or hydronephrosis. Electronically Signed   By: Markus Daft M.D.   On: 06/08/2016 08:38    ____________________________________________   PROCEDURES  Procedure(s) performed: None  Procedures  Critical Care performed: No ____________________________________________   INITIAL IMPRESSION / ASSESSMENT AND PLAN / ED COURSE  Pertinent labs & imaging results that were available during my care of the patient were reviewed by me and considered in my medical decision making (see chart for details).  26 y.o. female, nonpregnant, with right lower quadrant abdominal pain. Overall, the patient looks uncomfortable, came in mildly tachycardic, and continues to have discomfort with an elevated white blood  cell count. Early appendicitis, or ovarian pathology or possible. Renal colic is also on the differential. I will start with a pelvic examination to determine the initial imaging study required. The patient will receive symptomatic treatment and be reevaluated for final disposition.  ----------------------------------------- 8:06 AM on 06/08/2016 -----------------------------------------  After pelvic examination, focality of the patient's pain is still unclear, so we will initiate CT renal stone protocol, and reevaluate. If the CT scan does not reveal any abnormalities, we will consider pelvic ultrasound versus continued observation.  ____________________________________________  FINAL CLINICAL IMPRESSION(S) / ED DIAGNOSES  Final diagnoses:  Right flank pain  Bacterial vaginosis  Uterine leiomyoma, unspecified location  Right tubo-ovarian mass    Clinical Course as of Jun 08 1099  Mon Jun 08, 2016  1022 The patient's pain has completely resolved this time. Her CT scan does not show any acute abnormality, and she has had an ultrasound which has not yet been read. I am awaiting the results and have initiated treatment for bacterial vaginosis given the clue cells on her wet prep. Once I have all of the results I will reevaluate the patient for final disposition.  [AN]    Clinical Course User Index [AN] Eula Listen, MD   ----------------------------------------- 11:01 AM on 06/08/2016 -----------------------------------------  The patient continues to be symptomatic at this time. Her ultrasound does show a hyperechoic mass in the right ovary, which may be  related to her previous surgery or a dermoid cyst. I have discussed these findings with the patient, as well as told her about her increased risk of ovarian torsion and red flag warnings for return. She will be treated for bacterial vaginosis, and follow up with our gynecologist on-call. She now she is return precautions as well  as follow-up instructions.   NEW MEDICATIONS STARTED DURING THIS VISIT:  New Prescriptions   IBUPROFEN (ADVIL,MOTRIN) 800 MG TABLET    Take 1 tablet (800 mg total) by mouth every 8 (eight) hours as needed.   METRONIDAZOLE (FLAGYL) 500 MG TABLET    Take 1 tablet (500 mg total) by mouth 2 (two) times daily.      Eula Listen, MD 06/08/16 1101

## 2016-11-07 IMAGING — US US TRANSVAGINAL NON-OB
1 series · 13 of 25 positions shown · non-contrast
Comparison: September 21, 2014 pelvic ultrasound report available ; images
from that study cannot be retrieved.

CLINICAL DATA: Left lower quadrant/ pelvic pain. Previous surgical
removal of right-sided ectopic gestation.

EXAM:
TRANSABDOMINAL AND TRANSVAGINAL ULTRASOUND OF PELVIS
TECHNIQUE: Study was performed transabdominally to optimize pelvic field of
view evaluation and transvaginally to optimize internal visceral
architecture evaluation.

[Series 1: us transvaginal non-ob · 0.17mm/px · 13 of 86 slices shown]
[im 1/86]
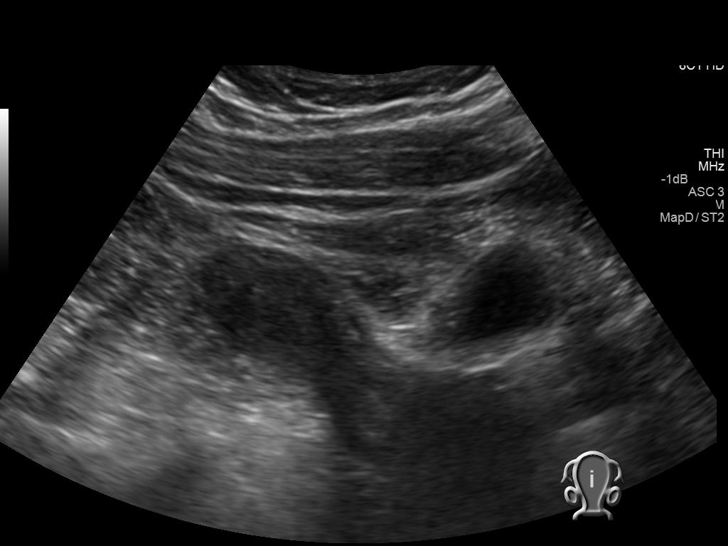
[im 8/86]
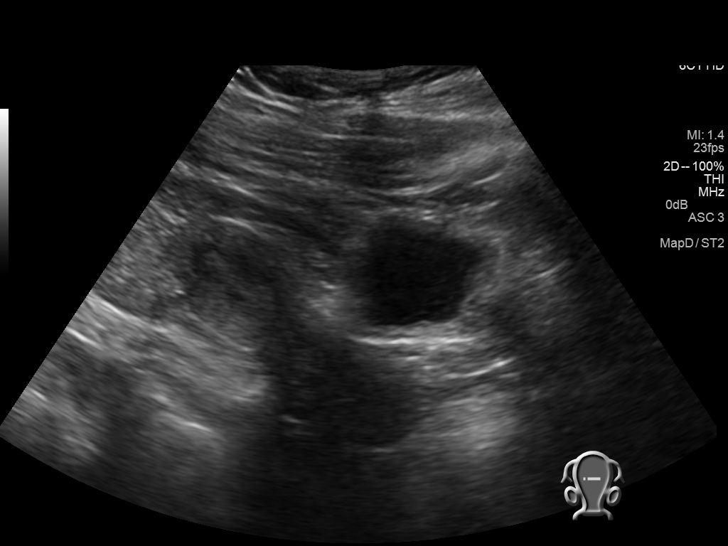
[im 15/86]
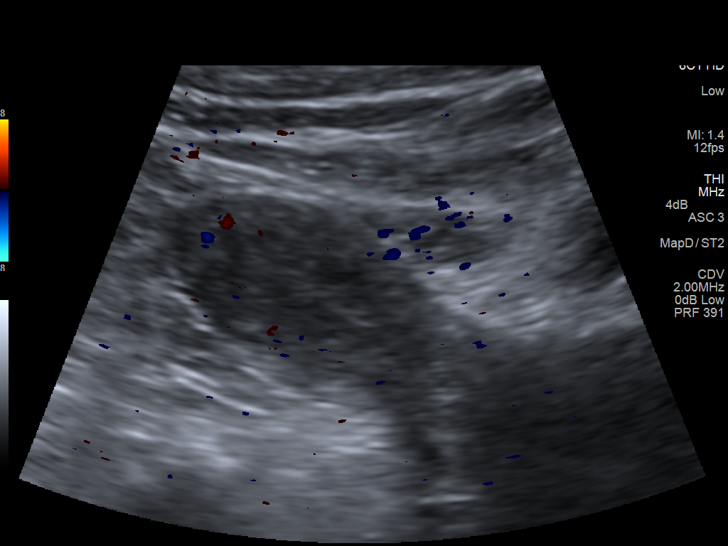
[im 22/86]
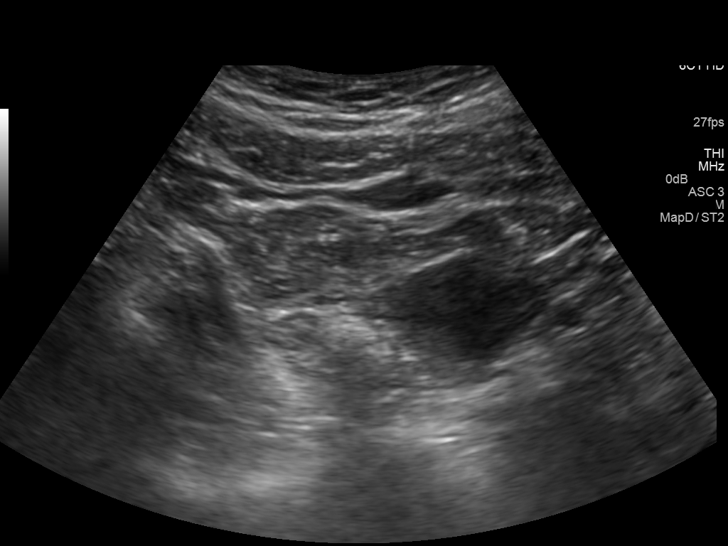
[im 29/86]
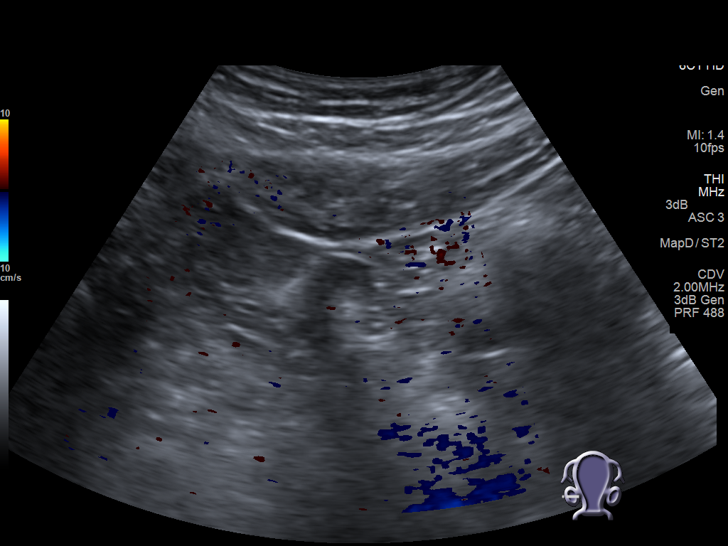
[im 36/86]
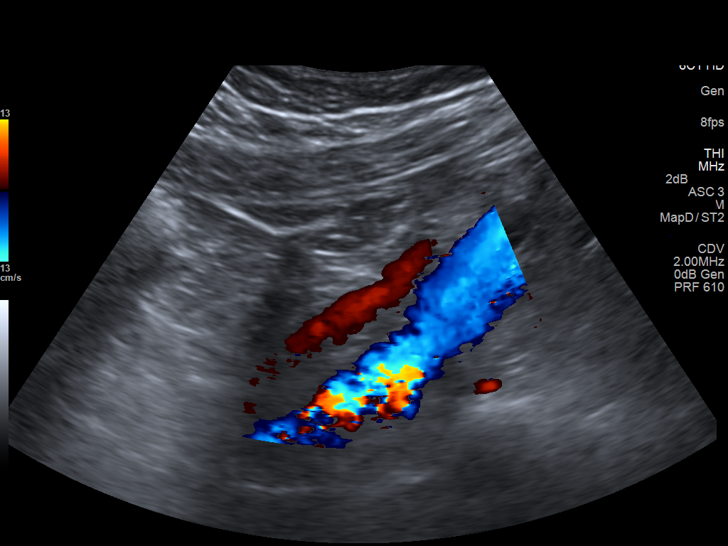
[im 43/86]
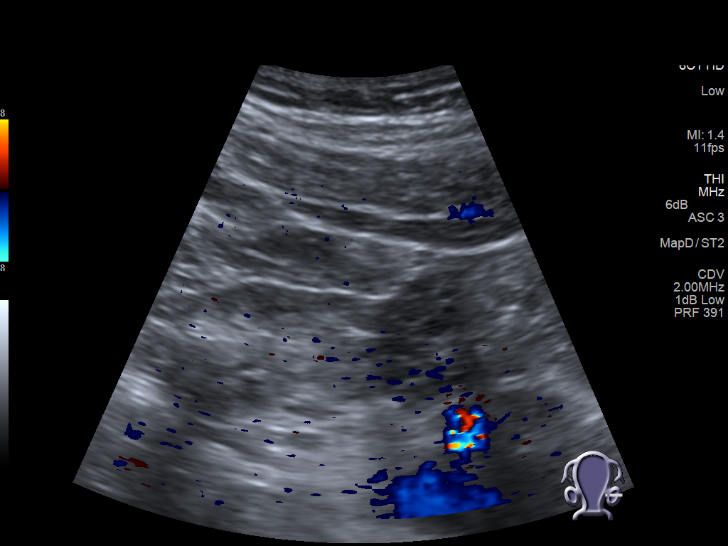
[im 50/86]
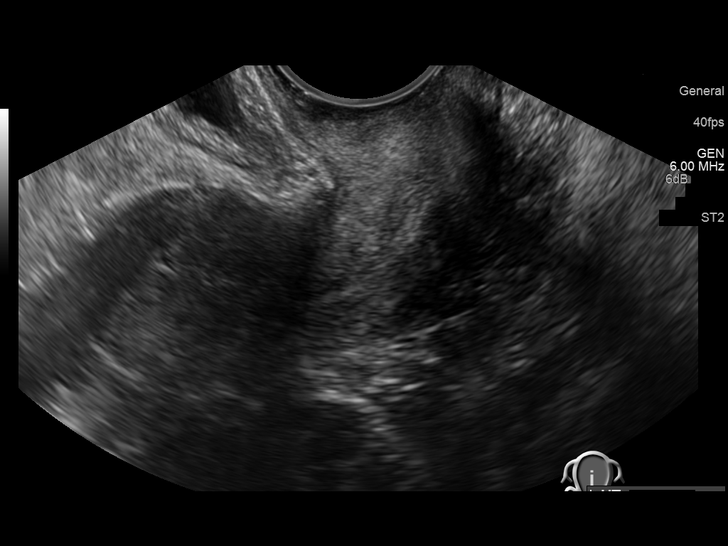
[im 57/86]
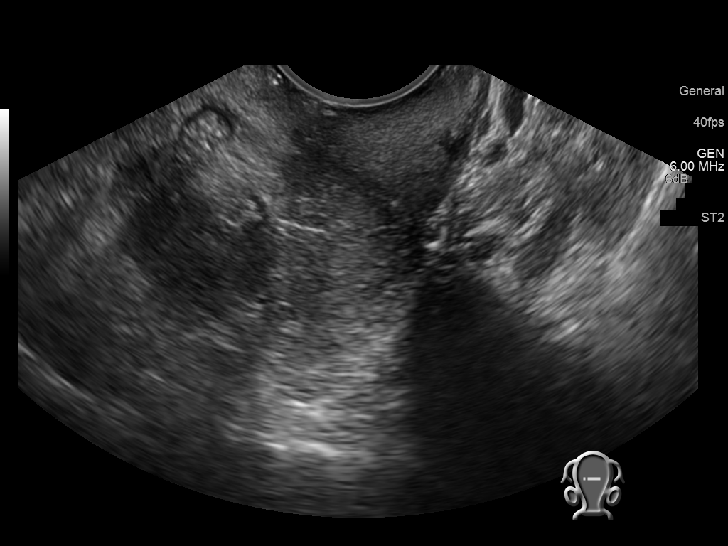
[im 64/86]
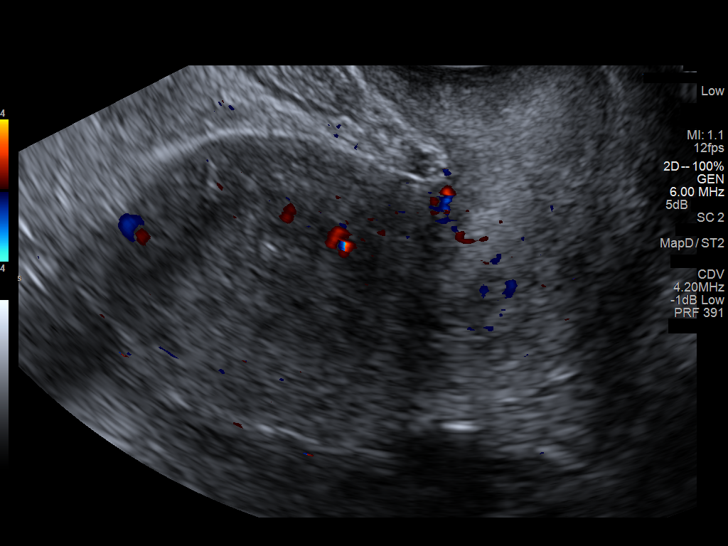
[im 71/86]
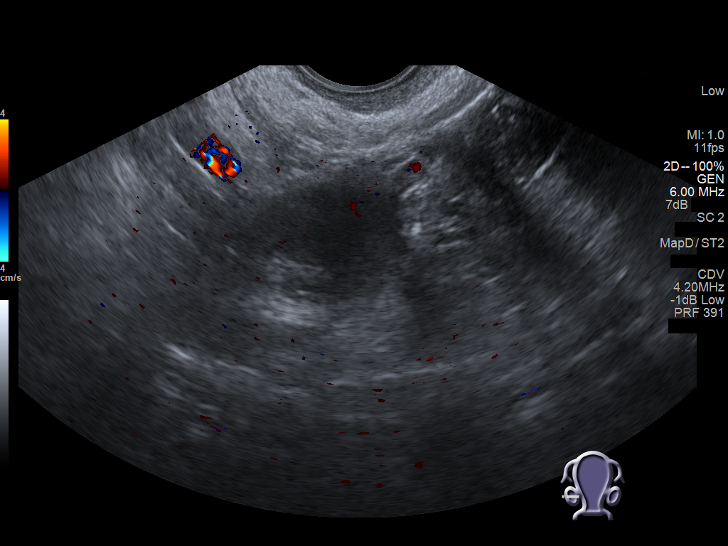
[im 78/86]
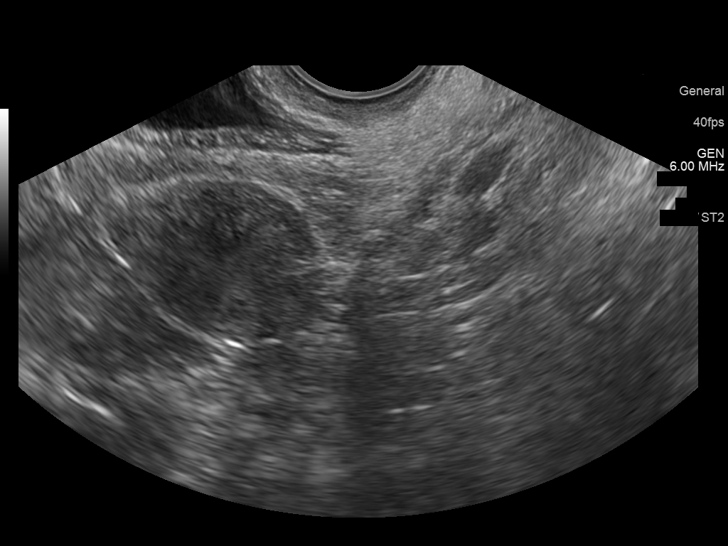
[im 86/86]
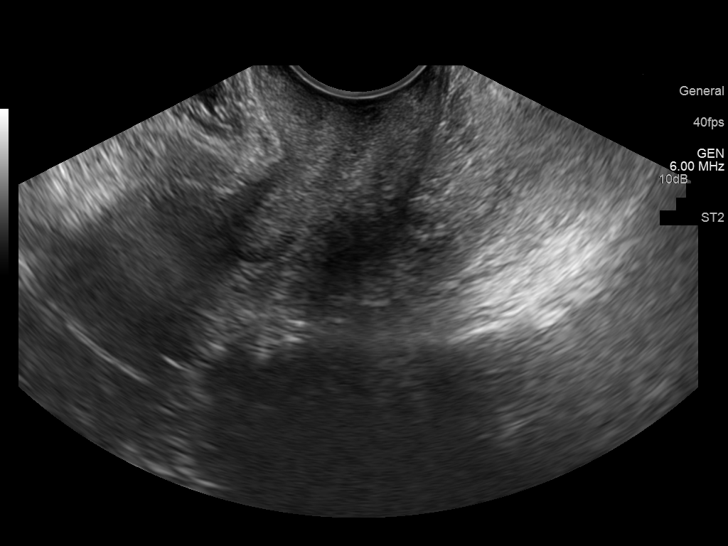

[13 of 25 positions shown; findings below may reference images not displayed]

FINDINGS: Uterus

Measurements: 7.0 x 2.7 x 3.9 cm. No fibroids or other mass
visualized.

Endometrium

Thickness: 6 mm.  No focal abnormality visualized.

Right ovary

Measurements: 4.8 x 3.3 x 3.3 cm. There is no evidence of
right-sided pelvic or adnexal mass. The linear area of increased
echogenicity in the adnexal region may represent scar tissue from
previous surgery.

Left ovary

Measurements: 3.5 x 2.2 x 2.5 cm. Normal appearance/no adnexal mass.
Previously noted left ovarian cyst has resolved.

Other findings

No free fluid.
IMPRESSION: Suspect scarring in the region the right adnexa. No intrauterine or
extrauterine pelvic or adnexal masses appreciable. Previously noted
left ovarian cyst is resolved. No new lesion.

## 2016-11-18 ENCOUNTER — Emergency Department: Payer: Self-pay

## 2016-11-18 ENCOUNTER — Emergency Department
Admission: EM | Admit: 2016-11-18 | Discharge: 2016-11-18 | Disposition: A | Discharge status: Home / Self Care | Payer: 59 | Attending: Emergency Medicine | Admitting: Emergency Medicine

## 2016-11-18 ENCOUNTER — Emergency Department
Admission: EM | Admit: 2016-11-18 | Discharge: 2016-11-18 | Disposition: A | Discharge status: Home / Self Care | Payer: Self-pay | Attending: Emergency Medicine | Admitting: Emergency Medicine

## 2016-11-18 DIAGNOSIS — R109 Unspecified abdominal pain: Secondary | ICD-10-CM | POA: Diagnosis present

## 2016-11-18 DIAGNOSIS — Z5321 Procedure and treatment not carried out due to patient leaving prior to being seen by health care provider: Secondary | ICD-10-CM | POA: Insufficient documentation

## 2016-11-18 DIAGNOSIS — K6389 Other specified diseases of intestine: Secondary | ICD-10-CM

## 2016-11-18 DIAGNOSIS — Z8759 Personal history of other complications of pregnancy, childbirth and the puerperium: Secondary | ICD-10-CM | POA: Insufficient documentation

## 2016-11-18 DIAGNOSIS — K529 Noninfective gastroenteritis and colitis, unspecified: Secondary | ICD-10-CM | POA: Insufficient documentation

## 2016-11-18 LAB — COMPREHENSIVE METABOLIC PANEL
ALK PHOS: 64 U/L (ref 38–126)
ALT: 22 U/L (ref 14–54)
AST: 21 U/L (ref 15–41)
Albumin: 3.9 g/dL (ref 3.5–5.0)
Anion gap: 5 (ref 5–15)
BUN: 16 mg/dL (ref 6–20)
CALCIUM: 9.6 mg/dL (ref 8.9–10.3)
CO2: 27 mmol/L (ref 22–32)
CREATININE: 1.03 mg/dL — AB (ref 0.44–1.00)
Chloride: 109 mmol/L (ref 101–111)
Glucose, Bld: 107 mg/dL — ABNORMAL HIGH (ref 65–99)
Potassium: 4 mmol/L (ref 3.5–5.1)
Sodium: 141 mmol/L (ref 135–145)
Total Bilirubin: 0.5 mg/dL (ref 0.3–1.2)
Total Protein: 7.1 g/dL (ref 6.5–8.1)

## 2016-11-18 LAB — CBC
HCT: 38.8 % (ref 35.0–47.0)
Hemoglobin: 12.7 g/dL (ref 12.0–16.0)
MCH: 26.9 pg (ref 26.0–34.0)
MCHC: 32.8 g/dL (ref 32.0–36.0)
MCV: 82 fL (ref 80.0–100.0)
PLATELETS: 246 10*3/uL (ref 150–440)
RBC: 4.74 MIL/uL (ref 3.80–5.20)
RDW: 13.3 % (ref 11.5–14.5)
WBC: 7.7 10*3/uL (ref 3.6–11.0)

## 2016-11-18 LAB — URINALYSIS, COMPLETE (UACMP) WITH MICROSCOPIC
BILIRUBIN URINE: NEGATIVE
GLUCOSE, UA: NEGATIVE mg/dL
Hgb urine dipstick: NEGATIVE
KETONES UR: NEGATIVE mg/dL
NITRITE: NEGATIVE
PH: 6 (ref 5.0–8.0)
Protein, ur: NEGATIVE mg/dL
Specific Gravity, Urine: 1.025 (ref 1.005–1.030)

## 2016-11-18 LAB — POCT PREGNANCY, URINE: Preg Test, Ur: NEGATIVE

## 2016-11-18 LAB — HCG, QUANTITATIVE, PREGNANCY: hCG, Beta Chain, Quant, S: <1 m[IU]/mL (ref <5)

## 2016-11-18 LAB — LIPASE, BLOOD: LIPASE: 31 U/L (ref 11–51)

## 2016-11-18 MED ORDER — IOPAMIDOL (ISOVUE-300) INJECTION 61%
100.0000 mL | Freq: Once | INTRAVENOUS | Status: AC | PRN
  Administered 2016-11-18: 100 mL via INTRAVENOUS

## 2016-11-18 MED ORDER — NAPROXEN 500 MG PO TABS: 500.0000 mg | ORAL_TABLET | Freq: Two times a day (BID) | ORAL | 0 refills | Status: AC

## 2016-11-18 MED ORDER — KETOROLAC TROMETHAMINE 30 MG/ML IJ SOLN
15.0000 mg | Freq: Once | INTRAMUSCULAR | Status: AC
  Administered 2016-11-18: 15 mg via INTRAVENOUS
  Filled 2016-11-18: qty 1

## 2016-11-18 NOTE — Discharge Instructions (Signed)
You have been seen in the Emergency Department (ED) for abdominal pain.  You were diagnosed with epiploic appendigitis which is an inflammation of the fat around your intestine.  Follow up with your doctor in 12-24 hours if you are still having abdominal pain. Otherwise follow up in 1-3 days for a re-check  Don't ignore new symptoms, such as fever, nausea and vomiting, new or worsening abdominal pain, urination problems, bloody diarrhea or bloody stools, black tarry stools, uncontrollable nausea and vomiting, and dizziness. These may be signs of a more serious problem. If you develop any of these you should be seen by your doctor immediately or return to the ED.   How can you care for yourself at home?  Rest until you feel better.  To prevent dehydration, drink plenty of fluids, enough so that your urine is light yellow or clear like water. Choose water and other caffeine-free clear liquids until you feel better. If you have kidney, heart, or liver disease and have to limit fluids, talk with your doctor before you increase the amount of fluids you drink.  If your stomach is upset, eat mild foods, such as rice, dry toast or crackers, bananas, and applesauce. Try eating several small meals instead of two or three large ones.  Wait until 48 hours after all symptoms have gone away before you have spicy foods, alcohol, and drinks that contain caffeine.  Do not eat foods that are high in fat.  Avoid anti-inflammatory medicines such as aspirin, ibuprofen (Advil, Motrin), and naproxen (Aleve). These can cause stomach upset. Talk to your doctor if you take daily aspirin for another health problem.  When should you call for help?  Call 911 anytime you think you may need emergency care. For example, call if:  You passed out (lost consciousness).  You pass maroon or very bloody stools.  You vomit blood or what looks like coffee grounds.  You have new, severe belly pain.  Call your doctor now or seek  immediate medical care if:  Your pain gets worse, especially if it becomes focused in one area of your belly.  You have a new or higher fever.  Your stools are black and look like tar, or they have streaks of blood.  You have unexpected vaginal bleeding.  You have symptoms of a urinary tract infection. These may include:  Pain when you urinate.  Urinating more often than usual.  Blood in your urine. You are dizzy or lightheaded, or you feel like you may faint. Watch closely for changes in your health, and be sure to contact your doctor if:  You are not getting better after 1 day (24 hours).

## 2016-11-18 NOTE — ED Triage Notes (Signed)
Pt reports lower left abd pain that started 2 days ago - pt was here last pm but left without being seen - denies N/V/diarrhea - denies pain or frequency with urination - denies back pain

## 2016-11-18 NOTE — ED Triage Notes (Signed)
Pt states that she has been having LLQ pain since yesterday that feels like a ball of pain that is intermittently throbbing.  Pt denies N/V/D at this time.  Pt states that she does have a hx of ovarian cysts, but states that she is not sure if the pain is similar or not.

## 2016-11-18 NOTE — ED Provider Notes (Signed)
[facility] Emergency Department Provider Note  ____________________________________________  Time seen: Approximately 12:36 PM  I have reviewed the triage vital signs and the nursing notes.   HISTORY  Chief Complaint Abdominal Pain   HPI Megan Benson is a 26 y.o. female with a history of a prior ectopic pregnancy who presents for evaluation of left-sided abdominal pain. Patient reports a very irregular menses with her last period being 30 days ago. She has had 2 days of constant dull pain located in the left side of her abdomen that is nonradiating. Currently 8 out of 10. No vaginal discharge, no vaginal bleeding, no dysuria, no hematuria, no fever or chills, no nausea or vomiting, no diarrhea or constipation. Patient was here 12 hours ago with normal labs the left before being seen by physician. She comes back today for continuing pain. She has had a salpingectomy a year ago but no other abdominal surgeries.  History reviewed. No pertinent past medical history.  There are no active problems to display for this patient.   Past Surgical History:  Procedure Laterality Date  . ECTOPIC PREGNANCY SURGERY      Prior to Admission medications   Medication Sig Start Date End Date Taking? Authorizing Provider  HYDROcodone-acetaminophen (NORCO) 5-325 MG tablet Take 1-2 tablets by mouth every 4 (four) hours as needed for moderate pain. Patient not taking: Reported on 06/08/2016 02/15/16   Arlyss Repress, PA-C  ibuprofen (ADVIL,MOTRIN) 800 MG tablet Take 1 tablet (800 mg total) by mouth every 8 (eight) hours as needed. Patient not taking: Reported on 11/18/2016 06/08/16   Eula Listen, MD  naproxen (NAPROSYN) 500 MG tablet Take 1 tablet (500 mg total) by mouth 2 (two) times daily with a meal. 11/18/16 11/18/17  Rudene Re, MD    Allergies Patient has no known allergies.  No family history on file.  Social History Social History  Substance  Use Topics  . Smoking status: Never Smoker  . Smokeless tobacco: Never Used  . Alcohol use No    Review of Systems  Constitutional: Negative for fever. Eyes: Negative for visual changes. ENT: Negative for sore throat. Neck: No neck pain  Cardiovascular: Negative for chest pain. Respiratory: Negative for shortness of breath. Gastrointestinal: + left sided abdominal pain. no vomiting or diarrhea. Genitourinary: Negative for dysuria. Musculoskeletal: Negative for back pain. Skin: Negative for rash. Neurological: Negative for headaches, weakness or numbness. Psych: No SI or HI  ____________________________________________   PHYSICAL EXAM:  VITAL SIGNS: ED Triage Vitals  Enc Vitals Group     BP 11/18/16 1224 116/71     Pulse Rate 11/18/16 1224 84     Resp 11/18/16 1224 15     Temp 11/18/16 1224 98.9 F (37.2 C)     Temp Source 11/18/16 1224 Oral     SpO2 11/18/16 1224 99 %     Weight 11/18/16 1224 236 lb (107 kg)     Height 11/18/16 1224 5' (1.524 m)     Head Circumference --      Peak Flow --      Pain Score 11/18/16 1223 7     Pain Loc --      Pain Edu? --      Excl. in [place]? --     Constitutional: Alert and oriented. Well appearing and in no apparent distress. HEENT:      Head: Normocephalic and atraumatic.         Eyes: Conjunctivae are normal. Sclera is non-icteric.  Mouth/Throat: Mucous membranes are moist.       Neck: Supple with no signs of meningismus. Cardiovascular: Regular rate and rhythm. No murmurs, gallops, or rubs. 2+ symmetrical distal pulses are present in all extremities. No JVD. Respiratory: Normal respiratory effort. Lungs are clear to auscultation bilaterally. No wheezes, crackles, or rhonchi.  Gastrointestinal: Soft, ttp over the LUQ, and non distended with positive bowel sounds. No rebound or guarding. Genitourinary: No CVA tenderness. Musculoskeletal: Nontender with normal range of motion in all extremities. No edema, cyanosis, or  erythema of extremities. Neurologic: Normal speech and language. Face is symmetric. Moving all extremities. No gross focal neurologic deficits are appreciated. Skin: Skin is warm, dry and intact. No rash noted. Psychiatric: Mood and affect are normal. Speech and behavior are normal.  ____________________________________________   LABS (all labs ordered are listed, but only abnormal results are displayed)  Labs Reviewed  HCG, QUANTITATIVE, PREGNANCY   ____________________________________________  EKG  none ____________________________________________  RADIOLOGY  Ct a/p: 1. Inflammatory changes adjacent to the descending colon, likely representing epiploic appendagitis, fat necrosis, or less likely, diverticulitis. No associated abscess or free air. 2. Focal pleural thickening in the right lower lobe, felt to be related to atelectasis or inflammation. ____________________________________________   PROCEDURES  Procedure(s) performed: None Procedures Critical Care performed:  None ____________________________________________   INITIAL IMPRESSION / ASSESSMENT AND PLAN / ED COURSE   26 y.o. female with a history of a prior ectopic pregnancy who presents for evaluation of left-sided abdominal pain. Patient is well-appearing, in no distress, she has normal vital signs, her abdomen is soft, obese, tender to palpation on the left upper quadrant with no rebound or guarding. No tenderness on lower quadrants. Patient had blood work done 12 hours ago including CBC, CMP, lipase, and urine pregnancy and UA which were all with no acute findings. Ddx diverticulitis vs gastritis vs constipation vs ovarian cyst vs ectopic vs STD. Will get CT a/p and treat pain with IV toradol. Will get hCG to rule out ectopic.     _________________________ 2:21 PM on 11/18/2016 -----------------------------------------  CT scan concerning for epiploic appendicitis. Pain is well controlled with Toradol.  Patient can be discharged home on naproxen and close follow-up with PCP.  Pertinent labs & imaging results that were available during my care of the patient were reviewed by me and considered in my medical decision making (see chart for details).    ____________________________________________   FINAL CLINICAL IMPRESSION(S) / ED DIAGNOSES  Final diagnoses:  Epiploic appendagitis      NEW MEDICATIONS STARTED DURING THIS VISIT:  New Prescriptions   NAPROXEN (NAPROSYN) 500 MG TABLET    Take 1 tablet (500 mg total) by mouth 2 (two) times daily with a meal.     Note:  This document was prepared using Dragon voice recognition software and may include unintentional dictation errors.    Alfred Levins, [place], MD 11/18/16 [phone]

## 2017-08-06 ENCOUNTER — Emergency Department: Admission: EM | Admit: 2017-08-06 | Discharge: 2017-08-06 | Discharge status: Absent without leave | Payer: 59

## 2017-08-08 ENCOUNTER — Other Ambulatory Visit: Payer: Self-pay

## 2017-08-08 DIAGNOSIS — M273 Alveolitis of jaws: Secondary | ICD-10-CM | POA: Insufficient documentation

## 2017-08-08 DIAGNOSIS — K0889 Other specified disorders of teeth and supporting structures: Secondary | ICD-10-CM | POA: Diagnosis present

## 2017-08-08 DIAGNOSIS — Z79899 Other long term (current) drug therapy: Secondary | ICD-10-CM | POA: Insufficient documentation

## 2017-08-08 NOTE — ED Triage Notes (Signed)
Patient reports had tooth pulled a week ago, reports since then has had pain, throbbing and that left side of tongue has burning sensation.

## 2017-08-09 ENCOUNTER — Emergency Department
Admission: EM | Admit: 2017-08-09 | Discharge: 2017-08-09 | Disposition: A | Discharge status: Home / Self Care | Payer: Commercial Managed Care - PPO | Attending: Emergency Medicine | Admitting: Emergency Medicine

## 2017-08-09 DIAGNOSIS — M273 Alveolitis of jaws: Secondary | ICD-10-CM

## 2017-08-09 MED ORDER — OXYCODONE-ACETAMINOPHEN 5-325 MG PO TABS
1.0000 | ORAL_TABLET | Freq: Once | ORAL | Status: AC
  Administered 2017-08-09: 1 via ORAL
  Filled 2017-08-09: qty 1

## 2017-08-09 MED ORDER — IBUPROFEN 800 MG PO TABS: 800.0000 mg | ORAL_TABLET | Freq: Three times a day (TID) | ORAL | 0 refills | Status: DC | PRN

## 2017-08-09 NOTE — ED Notes (Signed)
Pt. Verbalizes understanding of d/c instructions, medications, and follow-up. VS stable.  Pt. In NAD at time of d/c and denies further concerns regarding this visit. Pt. Stable at the time of departure from the unit, departing unit by the safest and most appropriate manner per that pt condition and limitations with all belongings accounted for. Pt advised to return to the ED at any time for emergent concerns, or for new/worsening symptoms.   

## 2017-08-09 NOTE — ED Provider Notes (Signed)
[facility] Emergency Department Provider Note    First MD Initiated Contact with Patient 08/09/17 0129     (approximate)  I have reviewed the triage vital signs and the nursing notes.   HISTORY  Chief Complaint Dental Problem    HPI Megan ABBETT is a 27 y.o. female presents to the emergency department with left jaw pain at the site of a tooth extraction which was performed on 08/02/2017.  Patient states that since the time of extraction she has had continued pain in the area and a burning sensation to the left side of her tongue.  Patient denies any fever.  Patient states that she followed up with the dentist who informed her that she had a dry socket however no medications were prescribed.   Past medical history None There are no active problems to display for this patient.   Past Surgical History:  Procedure Laterality Date  . ECTOPIC PREGNANCY SURGERY      Prior to Admission medications   Medication Sig Start Date End Date Taking? Authorizing Provider  HYDROcodone-acetaminophen (NORCO) 5-325 MG tablet Take 1-2 tablets by mouth every 4 (four) hours as needed for moderate pain. Patient not taking: Reported on 06/08/2016 02/15/16   Arlyss Repress, PA-C  ibuprofen (ADVIL,MOTRIN) 800 MG tablet Take 1 tablet (800 mg total) by mouth every 8 (eight) hours as needed. Patient not taking: Reported on 11/18/2016 06/08/16   Eula Listen, MD  ibuprofen (ADVIL,MOTRIN) 800 MG tablet Take 1 tablet (800 mg total) by mouth every 8 (eight) hours as needed. 08/09/17   Gregor Hams, MD  naproxen (NAPROSYN) 500 MG tablet Take 1 tablet (500 mg total) by mouth 2 (two) times daily with a meal. 11/18/16 11/18/17  Rudene Re, MD    Allergies No known drug allergies  No family history on file.  Social History Social History   Tobacco Use  . Smoking status: Never Smoker  . Smokeless tobacco: Never Used  Substance Use Topics  . Alcohol use: No    . Drug use: Yes    Frequency: 7.0 times per week    Types: Marijuana    Review of Systems Constitutional: No fever/chills Eyes: No visual changes. ENT: No sore throat.  Positive for dental pain Cardiovascular: Denies chest pain. Respiratory: Denies shortness of breath. Gastrointestinal: No abdominal pain.  No nausea, no vomiting.  No diarrhea.  No constipation. Genitourinary: Negative for dysuria. Musculoskeletal: Negative for neck pain.  Negative for back pain. Integumentary: Negative for rash. Neurological: Negative for headaches, focal weakness or numbness.   ____________________________________________   PHYSICAL EXAM:  VITAL SIGNS: ED Triage Vitals  Enc Vitals Group     BP 08/08/17 2204 130/76     Pulse Rate 08/08/17 2204 61     Resp 08/09/17 0159 17     Temp 08/08/17 2204 98.1 F (36.7 C)     Temp Source 08/08/17 2204 Oral     SpO2 08/08/17 2204 100 %     Weight 08/08/17 2204 110.2 kg (243 lb)     Height 08/08/17 2204 1.524 m (5')     Head Circumference --      Peak Flow --      Pain Score 08/08/17 2204 10     Pain Loc --      Pain Edu? --      Excl. in [place]? --     Constitutional: Alert and oriented. Well appearing and in no acute distress. Eyes: Conjunctivae are normal.  Head: Atraumatic. Mouth/Throat: Mucous membranes are moist. Oropharynx non-erythematous.  Tooth extraction site no signs of infection Neck: No stridor.   Cardiovascular: Normal rate, regular rhythm. Good peripheral circulation. Grossly normal heart sounds.  Musculoskeletal: No lower extremity tenderness nor edema. No gross deformities of extremities. Neurologic:  Normal speech and language. No gross focal neurologic deficits are appreciated.  Skin:  Skin is warm, dry and intact. No rash noted. Psychiatric: Mood and affect are normal. Speech and behavior are normal.       Procedures   ____________________________________________   INITIAL IMPRESSION / ASSESSMENT AND PLAN / ED  COURSE  As part of my medical decision making, I reviewed the following data within the electronic MEDICAL RECORD NUMBER   27 year old female presented with above-stated history and physical exam with concern for possible dry socket versus infection at tooth extraction site.  Clinical exam revealed no evidence of infection at the site of extraction.  Suspect a dry socket as the etiology for the patient's discomfort.  Patient will be prescribed ibuprofen 800 mg for home.    ____________________________________________  FINAL CLINICAL IMPRESSION(S) / ED DIAGNOSES  Final diagnoses:  Dry socket     MEDICATIONS GIVEN DURING THIS VISIT:  Medications  oxyCODONE-acetaminophen (PERCOCET/ROXICET) 5-325 MG per tablet 1 tablet (1 tablet Oral Given 08/09/17 0157)     ED Discharge Orders        Ordered    ibuprofen (ADVIL,MOTRIN) 800 MG tablet  Every 8 hours PRN     08/09/17 0142       Note:  This document was prepared using Dragon voice recognition software and may include unintentional dictation errors.    Gregor Hams, MD 08/09/17 [phone]

## 2017-08-09 NOTE — ED Notes (Signed)
Patient up to desk to receive a phone call.  No acute distress noted.

## 2018-02-18 IMAGING — US US TRANSVAGINAL NON-OB
1 series · 14 of 25 positions shown · non-contrast
Comparison: CT 06/08/2016.

CLINICAL DATA: Right lower quadrant pain.  Prior right ectopic.

EXAM:
TRANSABDOMINAL AND TRANSVAGINAL ULTRASOUND OF PELVIS
TECHNIQUE: Both transabdominal and transvaginal ultrasound examinations of the
pelvis were performed. Transabdominal technique was performed for
global imaging of the pelvis including uterus, ovaries, adnexal
regions, and pelvic cul-de-sac. It was necessary to proceed with
endovaginal exam following the transabdominal exam to visualize the
uterus and endometrium..

[Series 1: us transvaginal non-ob · 0.21mm/px · 14 of 104 slices shown]
[im 1/104]
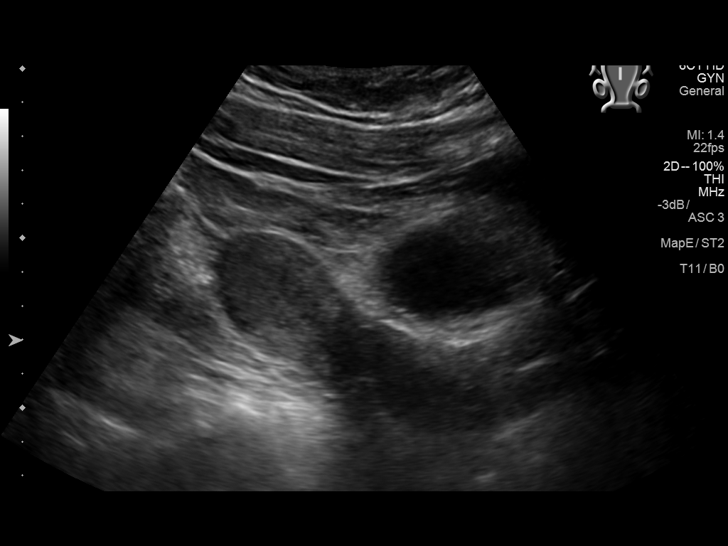
[im 9/104]
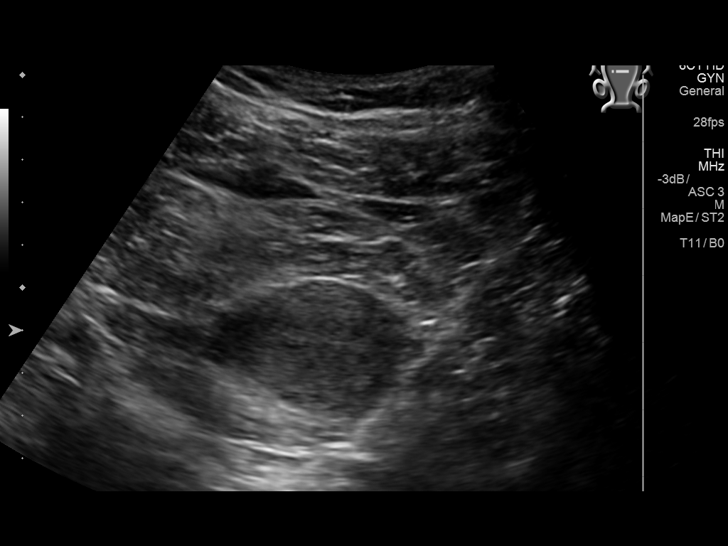
[im 18/104]
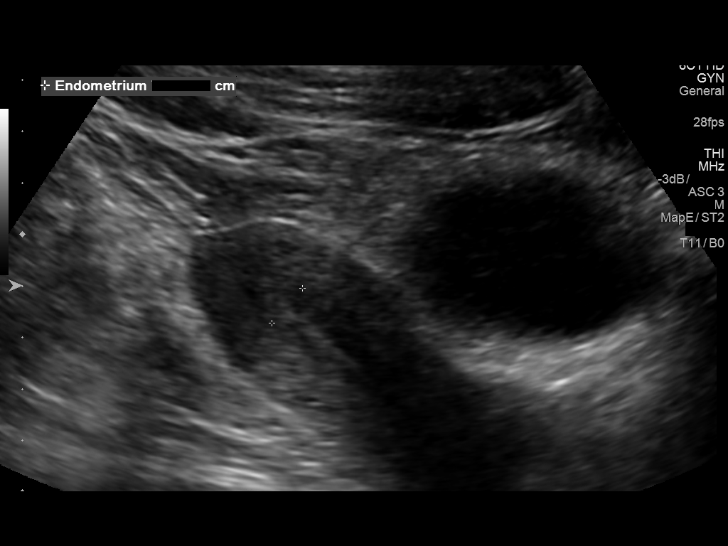
[im 26/104]
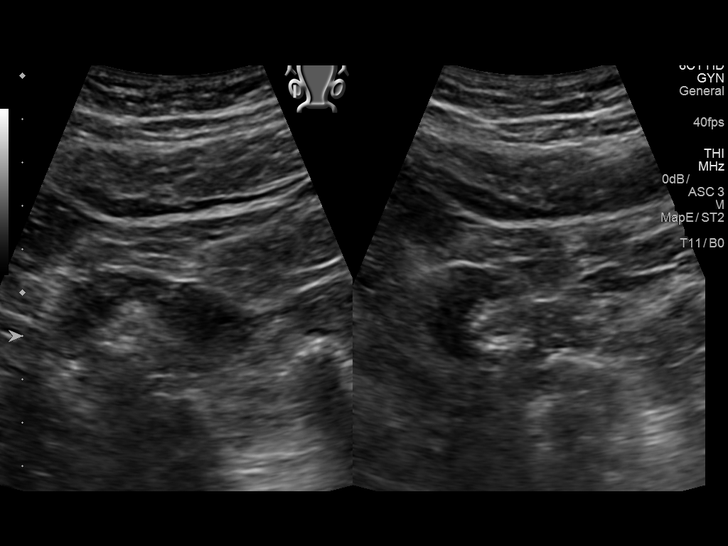
[im 35/104]
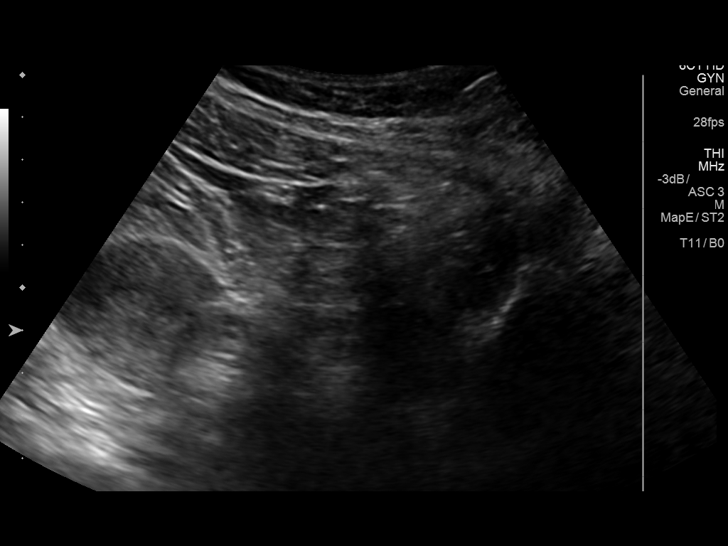
[im 39/104]
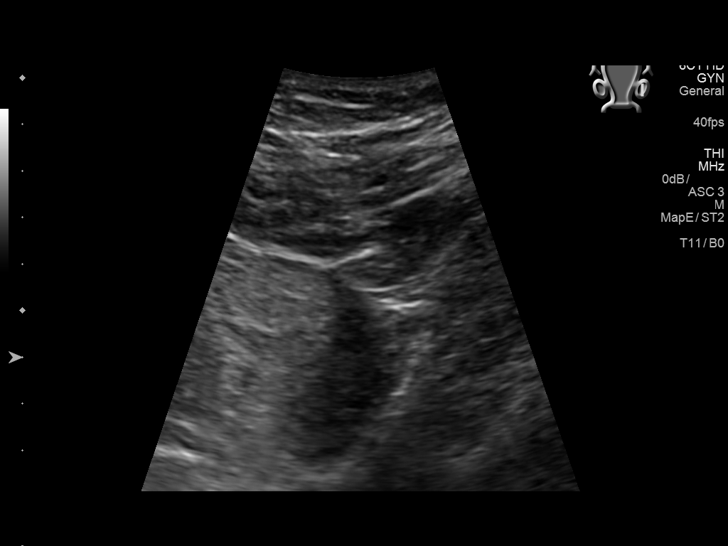
[im 48/104]
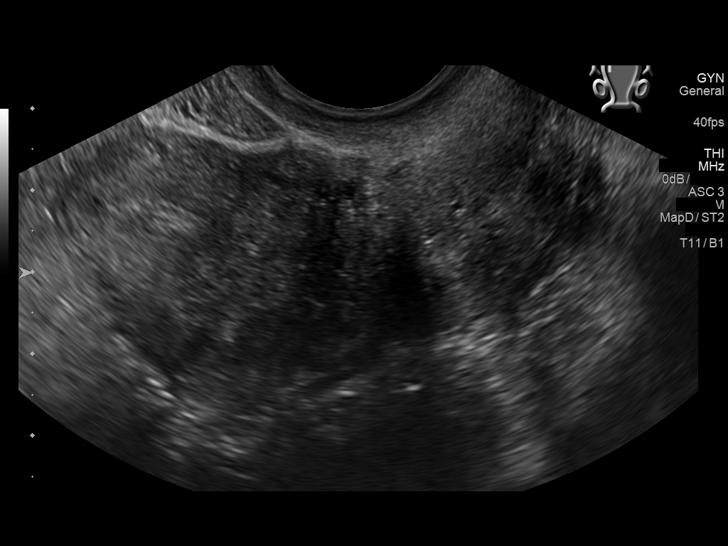
[im 56/104]
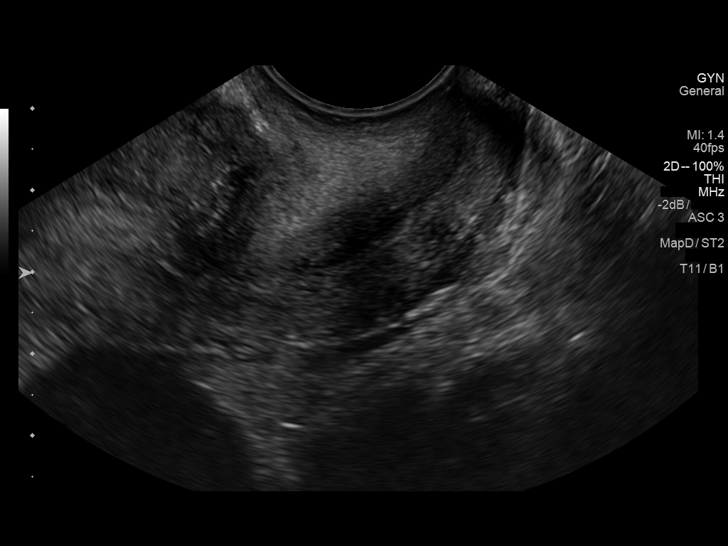
[im 65/104]
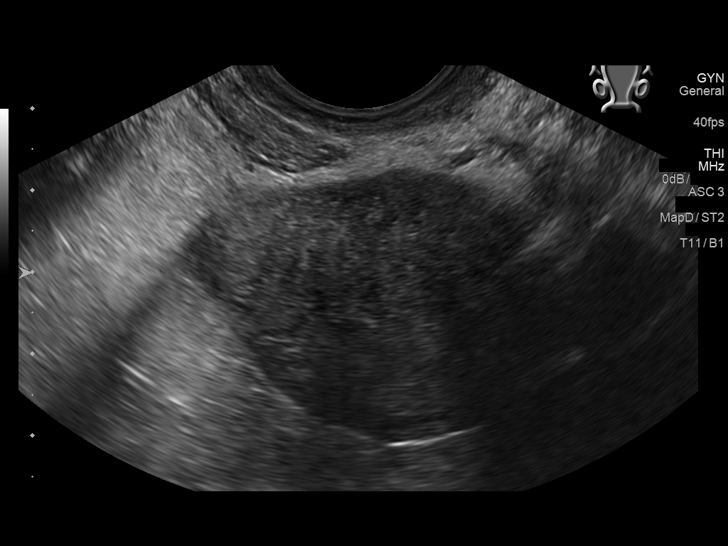
[im 69/104]
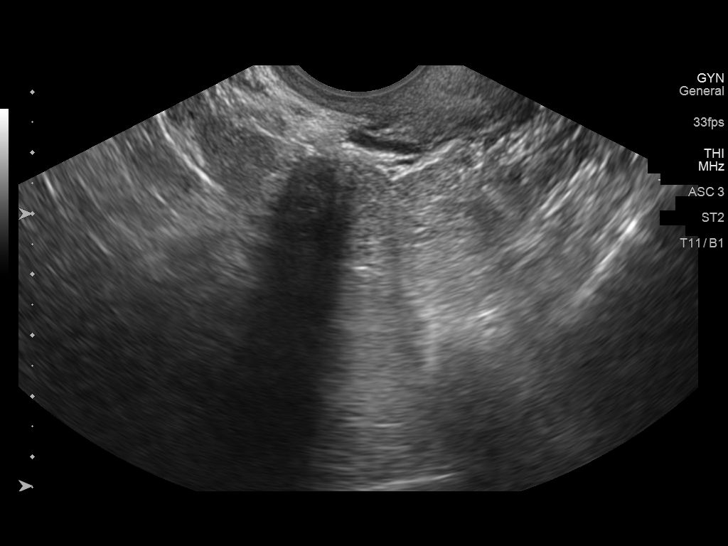
[im 78/104]
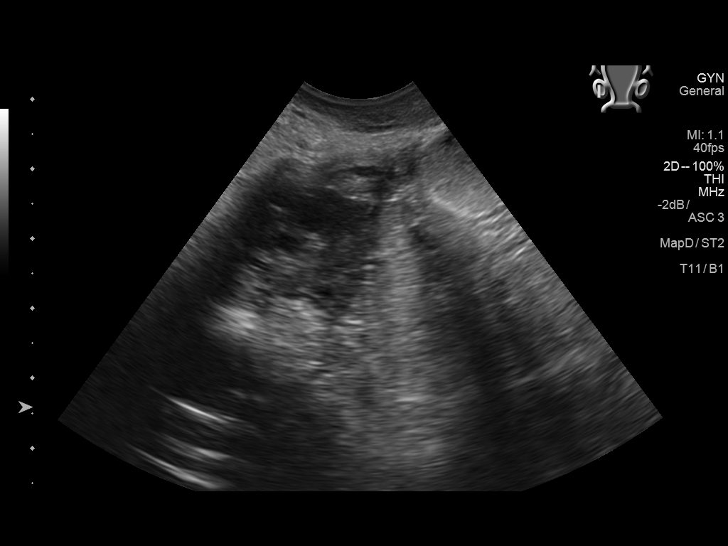
[im 86/104]
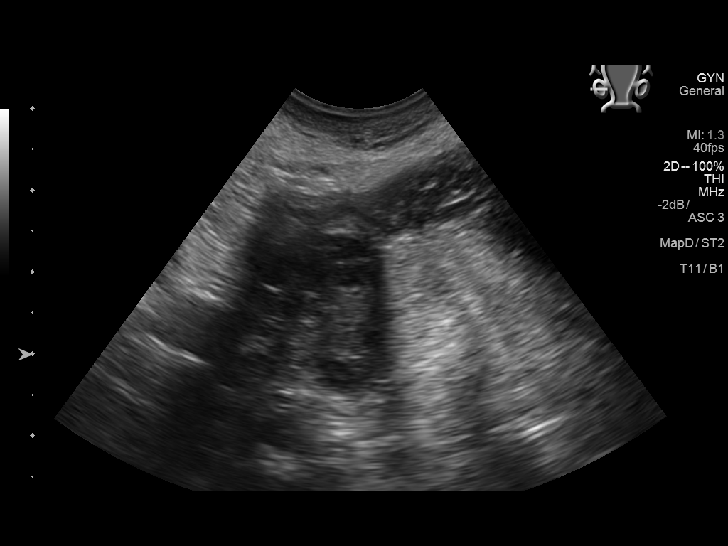
[im 95/104]
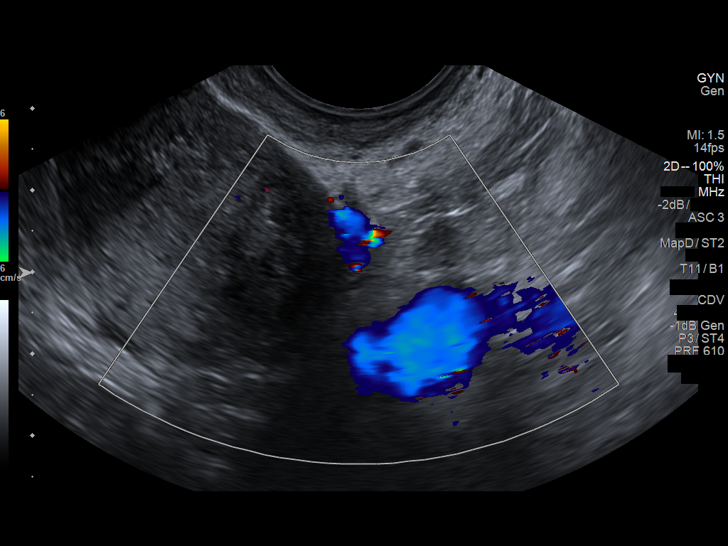
[im 104/104]
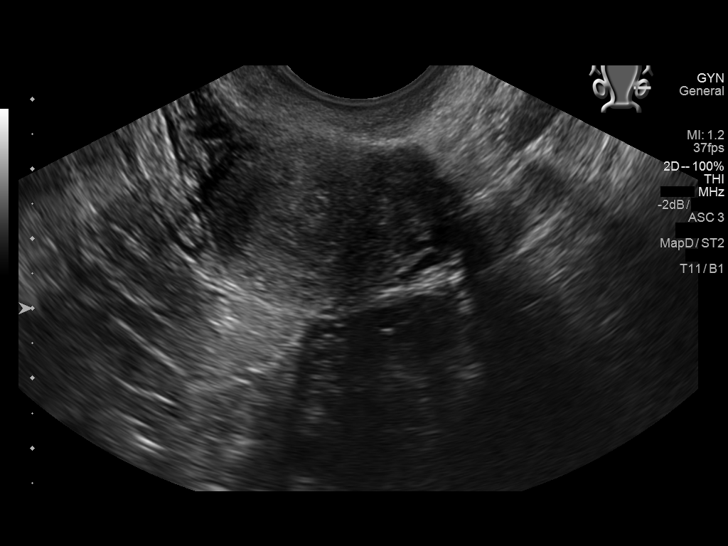

[14 of 25 positions shown; findings below may reference images not displayed]

FINDINGS: Uterus

Measurements: 9.6 x 3.0 x 3.9 cm. No fibroids or other mass
visualized.

Endometrium

Thickness: 5.8 mm.  No focal abnormality visualized.

Right ovary

Measurements: 3.7 x 2.1 x 1.6 cm. Focal hyperechoic region is noted
in the right ovary as noted on prior exam. This is most likely a
small lipoma. A component of scarring may also be present .

Left ovary

Measurements: 4.0 x 2.5 x 2.3 cm. Normal appearance/no adnexal mass.

Other findings

No abnormal free fluid.
IMPRESSION: No acute abnormality identified.

## 2018-10-31 ENCOUNTER — Emergency Department
Admission: EM | Admit: 2018-10-31 | Discharge: 2018-10-31 | Disposition: A | Discharge status: Home / Self Care | Payer: 59 | Attending: Student in an Organized Health Care Education/Training Program | Admitting: Student in an Organized Health Care Education/Training Program

## 2018-10-31 ENCOUNTER — Encounter: Payer: Self-pay | Admitting: Emergency Medicine

## 2018-10-31 ENCOUNTER — Other Ambulatory Visit: Payer: Self-pay

## 2018-10-31 DIAGNOSIS — G44209 Tension-type headache, unspecified, not intractable: Secondary | ICD-10-CM | POA: Diagnosis not present

## 2018-10-31 DIAGNOSIS — R51 Headache: Secondary | ICD-10-CM | POA: Diagnosis present

## 2018-10-31 MED ORDER — DICLOFENAC SODIUM 25 MG PO TBEC: 25.0000 mg | DELAYED_RELEASE_TABLET | Freq: Two times a day (BID) | ORAL | 0 refills | Status: DC

## 2018-10-31 NOTE — ED Triage Notes (Signed)
Pt reports headache since Friday unrelieved by OTC meds

## 2018-10-31 NOTE — ED Notes (Signed)
See triage note   States she developed h/a on Friday  Describes pain as "pounding" to left side of head   Was able to sleep  States she was able to get some relief with OTC med's   Denies any fever,n/v or photosensitivity   States pain remains mainly to left side of head and down into ear

## 2018-10-31 NOTE — ED Provider Notes (Signed)
[facility] Emergency Department Provider Note   ____________________________________________   First MD Initiated Contact with Patient 10/31/18 [phone]     (approximate)  I have reviewed the triage vital signs and the nursing notes.   HISTORY  Chief Complaint Headache    HPI Megan Benson is a 28 y.o. female patient presents with left facial headache for 3 days.  Patient the headache refractory to over-the-counter NSAIDs.  Patient denies vision change.  Patient denies, nausea, vomiting, or vertigo.  Patient states headache similar to previous episodes.  Patient denies facial or body weakness.  Patient rates the pain as a 7/10.  Patient described the pain is "throbbing".  No other palliative measures for complaint.         History reviewed. No pertinent past medical history.  There are no active problems to display for this patient.   Past Surgical History:  Procedure Laterality Date  . ECTOPIC PREGNANCY SURGERY      Prior to Admission medications   Medication Sig Start Date End Date Taking? Authorizing Provider  diclofenac (VOLTAREN) 25 MG EC tablet Take 1 tablet (25 mg total) by mouth 2 (two) times daily. 10/31/18   Sable Feil, PA-C    Allergies Patient has no known allergies.  No family history on file.  Social History Social History   Tobacco Use  . Smoking status: Never Smoker  . Smokeless tobacco: Never Used  Substance Use Topics  . Alcohol use: No  . Drug use: Yes    Frequency: 7.0 times per week    Types: Marijuana    Review of Systems Constitutional: No fever/chills Eyes: No visual changes. ENT: No sore throat. Cardiovascular: Denies chest pain. Respiratory: Denies shortness of breath. Gastrointestinal: No abdominal pain.  No nausea, no vomiting.  No diarrhea.  No constipation. Genitourinary: Negative for dysuria. Musculoskeletal: Negative for back pain. Skin: Negative for rash. Neurological: Positive for  headaches, but denies focal weakness or numbness.   ____________________________________________   PHYSICAL EXAM:  VITAL SIGNS: ED Triage Vitals  Enc Vitals Group     BP 10/31/18 0827 119/64     Pulse Rate 10/31/18 0827 61     Resp --      Temp 10/31/18 0827 97.9 F (36.6 C)     Temp Source 10/31/18 0827 Oral     SpO2 10/31/18 0827 99 %     Weight 10/31/18 0823 286 lb (129.7 kg)     Height 10/31/18 0823 5' (1.524 m)     Head Circumference --      Peak Flow --      Pain Score 10/31/18 0823 7     Pain Loc --      Pain Edu? --      Excl. in [place]? --    Constitutional: Alert and oriented. Well appearing and in no acute distress. Eyes: Conjunctivae are normal. PERRL. EOMI. Head: Atraumatic. Nose: No congestion/rhinnorhea. Cardiovascular: Normal rate, regular rhythm. Grossly normal heart sounds.  Good peripheral circulation. Respiratory: Normal respiratory effort.  No retractions. Lungs CTAB. Neurologic:  Normal speech and language. No gross focal neurologic deficits are appreciated. No gait instability. Skin:  Skin is warm, dry and intact. No rash noted. Psychiatric: Mood and affect are normal. Speech and behavior are normal.  ____________________________________________   LABS (all labs ordered are listed, but only abnormal results are displayed)  Labs Reviewed - No data to display ____________________________________________  EKG   ____________________________________________  RADIOLOGY  ED MD interpretation:  Official radiology report(s): No results found.  ____________________________________________   PROCEDURES  Procedure(s) performed (including Critical Care):  Procedures   ____________________________________________   INITIAL IMPRESSION / ASSESSMENT AND PLAN / ED COURSE  As part of my medical decision making, I reviewed the following data within the [place]         Patient presents with 3 days of left frontal lateral  headache.  Patient physical exam is unremarkable for acute neurological event.  Patient given discharge care instruction work note.  Patient vies take medication as directed.  Patient advised [facility]-up [facility] condition recurs.      ____________________________________________   FINAL CLINICAL IMPRESSION(S) / ED DIAGNOSES  Final diagnoses:  Tension headache     ED Discharge Orders         Ordered    diclofenac (VOLTAREN) 25 MG EC tablet  2 times daily     10/31/18 0844           Note:  This document was prepared using Dragon voice recognition software and may include unintentional dictation errors.    Sable Feil, PA-C 10/31/18 7741    Merlyn Lot, MD 10/31/18 [phone]

## 2019-05-19 DIAGNOSIS — U071 COVID-19: Secondary | ICD-10-CM

## 2019-05-19 HISTORY — DX: COVID-19: U07.1

## 2019-05-25 ENCOUNTER — Ambulatory Visit: Payer: 59 | Attending: Internal Medicine

## 2019-05-25 DIAGNOSIS — Z20822 Contact with and (suspected) exposure to covid-19: Secondary | ICD-10-CM

## 2019-05-27 LAB — NOVEL CORONAVIRUS, NAA: SARS-CoV-2, NAA: DETECTED — AB

## 2019-06-09 ENCOUNTER — Other Ambulatory Visit: Payer: 59

## 2019-06-09 ENCOUNTER — Other Ambulatory Visit: Payer: Self-pay

## 2019-06-09 ENCOUNTER — Ambulatory Visit (INDEPENDENT_AMBULATORY_CARE_PROVIDER_SITE_OTHER)
Admission: RE | Admit: 2019-06-09 | Discharge: 2019-06-09 | Disposition: A | Discharge status: Home / Self Care | Payer: 59 | Source: Ambulatory Visit

## 2019-06-09 ENCOUNTER — Ambulatory Visit: Payer: 59 | Attending: Internal Medicine

## 2019-06-09 DIAGNOSIS — Z7189 Other specified counseling: Secondary | ICD-10-CM

## 2019-06-09 DIAGNOSIS — Z0279 Encounter for issue of other medical certificate: Secondary | ICD-10-CM | POA: Diagnosis not present

## 2019-06-09 DIAGNOSIS — Z20822 Contact with and (suspected) exposure to covid-19: Secondary | ICD-10-CM

## 2019-06-09 NOTE — Discharge Instructions (Signed)
Follow-up with your primary care provider or come here to be seen in person if your ear discomfort is not improving.    A work note has been provided for you in [employer].

## 2019-06-09 NOTE — ED Provider Notes (Signed)
Virtual Visit via Video Note:  Megan Benson  initiated request for Telemedicine visit with [facility] Urgent Care team. I connected with Megan Benson  on 06/09/2019 at 2:19 PM  for a synchronized telemedicine visit using a video enabled HIPPA compliant telemedicine application. I verified that I am speaking with Megan Benson  using two identifiers. Sharion Balloon, NP  was physically located in a [facility] Urgent care site and Megan Benson was located at a different location.   The limitations of evaluation and management by telemedicine as well as the availability of in-person appointments were discussed. Patient was informed that she  may incur a bill ( including co-pay) for this virtual visit encounter. Megan Benson  expressed understanding and gave verbal consent to proceed with virtual visit.     History of Present Illness:Megan Benson  is a 29 y.o. female presents with request for a note to return to work.  Patient tested positive for COVID on 05/27/2019.  She denies fever in the past 24 hours.  She denies symptoms other than her left ear "feels stopped up".  She denies fever, chills, sore throat, cough, shortness of breath, vomiting, diarrhea, rash, or other symptoms.  She requests that her note for return to work be dated for Monday, 06/12/2019.       No Known Allergies   History reviewed. No pertinent past medical history.   Social History   Tobacco Use  . Smoking status: Never Smoker  . Smokeless tobacco: Never Used  Substance Use Topics  . Alcohol use: No  . Drug use: Yes    Frequency: 7.0 times per week    Types: Marijuana        Observations/Objective: Physical Exam   VITALS: Patient denies fever. GENERAL: Alert, appears well and in no acute distress. HEENT: Atraumatic. NECK: Normal movements of the head and neck. CARDIOPULMONARY: No increased WOB. Speaking in clear sentences. I:E ratio WNL.  MS: Moves all visible extremities  without noticeable abnormality. PSYCH: Pleasant and cooperative, well-groomed. Speech normal rate and rhythm. Affect is appropriate. Insight and judgement are appropriate. Attention is focused, linear, and appropriate.  NEURO: CN grossly intact. Oriented as arrived to appointment on time with no prompting. Moves both UE equally.  SKIN: No obvious lesions, wounds, erythema, or cyanosis noted on face or hands.   Assessment and Plan:    ICD-10-CM   1. Counseled about COVID-19 virus infection  Z71.89        Follow Up Instructions: Work note provided for return to work on 06/12/2019; patient requests this date.  Instructed patient to follow-up with her PCP or come here to be seen in person if her ear discomfort is not improving.  Patient agrees to plan of care.      I discussed the assessment and treatment plan with the patient. The patient was provided an opportunity to ask questions and all were answered. The patient agreed with the plan and demonstrated an understanding of the instructions.   The patient was advised to call back or seek an in-person evaluation if the symptoms worsen or if the condition fails to improve as anticipated.      Sharion Balloon, NP  06/09/2019 2:19 PM         Sharion Balloon, NP 06/09/19 1419

## 2019-06-10 LAB — NOVEL CORONAVIRUS, NAA: SARS-CoV-2, NAA: NOT DETECTED

## 2019-06-17 ENCOUNTER — Ambulatory Visit
Admission: EM | Admit: 2019-06-17 | Discharge: 2019-06-17 | Disposition: A | Discharge status: Home / Self Care | Payer: 59 | Attending: Family Medicine | Admitting: Family Medicine

## 2019-06-17 ENCOUNTER — Other Ambulatory Visit: Payer: Self-pay

## 2019-06-17 DIAGNOSIS — H6592 Unspecified nonsuppurative otitis media, left ear: Secondary | ICD-10-CM

## 2019-06-17 MED ORDER — AMOXICILLIN 875 MG PO TABS: 875.0000 mg | ORAL_TABLET | Freq: Two times a day (BID) | ORAL | 0 refills | Status: DC

## 2019-06-17 NOTE — Discharge Instructions (Addendum)
Take medication as prescribed. Rest. Drink plenty of fluids. Over the counter antihistamine.  Follow up with your primary care physician this week as needed. Return to Urgent care for new or worsening concerns.

## 2019-06-17 NOTE — ED Provider Notes (Signed)
[facility] ____________________________________________  Time seen: Approximately 11:55 AM  I have reviewed the triage vital signs and the nursing notes.   HISTORY  Chief Complaint Ear Fullness (left)   HPI Megan Benson is a 29 y.o. female presenting for evaluation of left ear discomfort.  Patient reports for the last few weeks she has had intermittent discomfort and clogged sensation.  States yesterday she used a Q-tip trying to clean out her ear and then pulled out what may have been part of a Q-tip that was in there.  Reports has had continued discomfort prompting her to come in.  Recently had COVID-19 at the beginning of January.  Reports has recovered well and doing well.  Denies fevers, cough or congestion currently.  Has taken over-the-counter occasional Mucinex without resolution.  Denies other aggravating or alleviating factors.   Past Medical History:  Diagnosis Date  . COVID-19 2021    There are no problems to display for this patient.   Past Surgical History:  Procedure Laterality Date  . ECTOPIC PREGNANCY SURGERY       No current facility-administered medications for this encounter.  Current Outpatient Medications:  .  amoxicillin (AMOXIL) 875 MG tablet, Take 1 tablet (875 mg total) by mouth 2 (two) times daily., Disp: 20 tablet, Rfl: 0 .  diclofenac (VOLTAREN) 25 MG EC tablet, Take 1 tablet (25 mg total) by mouth 2 (two) times daily., Disp: 20 tablet, Rfl: 0  Allergies Patient has no known allergies.  Family History  Problem Relation Age of Onset  . Healthy Mother   . Healthy Father     Social History Social History   Tobacco Use  . Smoking status: Never Smoker  . Smokeless tobacco: Never Used  Substance Use Topics  . Alcohol use: No  . Drug use: Yes    Frequency: 7.0 times per week    Types: Marijuana    Review of Systems Constitutional: No fever ENT: No sore throat. As above.  Cardiovascular: Denies chest  pain. Respiratory: Denies shortness of breath. Gastrointestinal: No abdominal pain.   Musculoskeletal: Negative for back pain. Skin: Negative for rash.   ____________________________________________   PHYSICAL EXAM:  VITAL SIGNS: ED Triage Vitals  Enc Vitals Group     BP 06/17/19 1111 130/79     Pulse Rate 06/17/19 1111 65     Resp -- 18     Temp 06/17/19 1111 98 F (36.7 C)     Temp Source 06/17/19 1111 Oral     SpO2 06/17/19 1111 100 %     Weight --      Height 06/17/19 1109 5' (1.524 m)     Head Circumference --      Peak Flow --      Pain Score 06/17/19 1109 0     Pain Loc --      Pain Edu? --      Excl. in [place]? --     Constitutional: Alert and oriented. Well appearing and in no acute distress. Eyes: Conjunctivae are normal.  Head: Atraumatic. No swelling. No erythema.  Ears:   Left: Nontender, normal canal, mild cerumen present, moderate erythema with effusion present.  Right: Nontender, normal canal, no erythema, normal TM. Hematological/Lymphatic/Immunilogical: No cervical lymphadenopathy. Cardiovascular: Normal rate, regular rhythm. Grossly normal heart sounds.  Good peripheral circulation. Respiratory: Normal respiratory effort.  No retractions. No wheezes, rales or rhonchi. Good air movement.  Musculoskeletal: Ambulatory with steady gait.  Neurologic:  Normal speech and language. No gait  instability. Skin:  Skin appears warm, dry and intact. No rash noted. Psychiatric: Mood and affect are normal. Speech and behavior are normal.  ___________________________________________   LABS (all labs ordered are listed, but only abnormal results are displayed)  Labs Reviewed - No data to display   PROCEDURES Procedures    INITIAL IMPRESSION / ASSESSMENT AND PLAN / ED COURSE  Pertinent labs & imaging results that were available during my care of the patient were reviewed by me and considered in my medical decision making (see chart for  details).  Well-appearing patient.  No acute distress.  Left otitis with effusion.  Encourage over-the-counter antihistamine use.  Will treat with oral amoxicillin.  Monitor.  Discussed indication, risks and benefits of medications with patient.  Discussed follow up and return parameters including no resolution or any worsening concerns. Patient verbalized understanding and agreed to plan.   ____________________________________________   FINAL CLINICAL IMPRESSION(S) / ED DIAGNOSES  Final diagnoses:  Left otitis media with effusion     ED Discharge Orders         Ordered    amoxicillin (AMOXIL) 875 MG tablet  2 times daily,   Status:  Discontinued     06/17/19 1140    amoxicillin (AMOXIL) 875 MG tablet  2 times daily     06/17/19 1141           Note: This dictation was prepared with Dragon dictation along with smaller phrase technology. Any transcriptional errors that result from this process are unintentional.         Marylene Land, NP 06/17/19 1157

## 2019-06-17 NOTE — ED Triage Notes (Signed)
Pt presents with c/o left ear fullness for several months. She states she does use q-tips in her ears. She did pull something out that seemed to be fiber or cotton-like. She denies any pain, loss of hearing or discharge/drainage.

## 2019-08-03 ENCOUNTER — Other Ambulatory Visit: Payer: Self-pay

## 2019-08-03 ENCOUNTER — Ambulatory Visit (INDEPENDENT_AMBULATORY_CARE_PROVIDER_SITE_OTHER)
Admission: RE | Admit: 2019-08-03 | Discharge: 2019-08-03 | Disposition: A | Discharge status: Home / Self Care | Payer: 59 | Source: Ambulatory Visit

## 2019-08-03 DIAGNOSIS — G5603 Carpal tunnel syndrome, bilateral upper limbs: Secondary | ICD-10-CM

## 2019-08-03 MED ORDER — PREDNISONE 20 MG PO TABS: 40.0000 mg | ORAL_TABLET | Freq: Every day | ORAL | 0 refills | Status: AC

## 2019-08-03 NOTE — ED Provider Notes (Signed)
Virtual Visit via Video Note:  Megan Benson  initiated request for Telemedicine visit with [facility] Urgent Care team. I connected with Megan Benson  on 08/03/2019 at 9:20 AM  for a synchronized telemedicine visit using a video enabled HIPPA compliant telemedicine application. I verified that I am speaking with Megan Benson  using two identifiers. [place], PA-C  was physically located in a [place] Urgent care site and Megan Benson was located at a different location.   The limitations of evaluation and management by telemedicine as well as the availability of in-person appointments were discussed. Patient was informed that she  may incur a bill ( including co-pay) for this virtual visit encounter. Megan Benson  expressed understanding and gave verbal consent to proceed with virtual visit.     History of Present Illness:Megan Benson  is a 29 y.o. female presents with concern for bilateral carpal tunnel flare.  States that she has had carpal tunnel syndrome in both hands "for years ".  States that she is to take a medication for flares, though cannot member the name: States over tiny, white pills.  Patient also has wrist braces which she has not been using routinely.  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  States that paresthesias occur in her first 3 digits, worse when laying down or using hands for a long time.  Does not routinely take medications for this.  Has never been consulted by specialist.  Patient states that her hands will swell and turn red sometimes: States that they have been swelling for the last few days which prompted her to seek evaluation today.  Denies trauma, injury.   ROI as per HPI  Past Medical History:  Diagnosis Date  . COVID-19 2021    No Known Allergies      Observations/Objective: 29 y.o. female sitting in no acute distress.  Patient is able to speak in full sentences without coughing,  sneezing, wheezing.  Patient to move all fingers in both hands.  Negative Tinel's.  Assessment and Plan: H&P consistent with carpal tunnel syndrome flare.  Stressed importance of follow-up with PCP, specialist for persistent, worsening symptoms and increased frequency of flares as further intervention may be needed.  We will start prednisone today, encouraged wrist brace utilization, and directed patient follow-up with PCP.  Return precautions discussed, patient verbalized understanding and is agreeable to plan.  Follow Up Instructions: Patient is take in-person evaluation for persistent or worsening symptoms.   I discussed the assessment and treatment plan with the patient. The patient was provided an opportunity to ask questions and all were answered. The patient agreed with the plan and demonstrated an understanding of the instructions.   The patient was advised to call back or seek an in-person evaluation if the symptoms worsen or if the condition fails to improve as anticipated.  I provided 15 minutes of non-face-to-face time during this encounter.    [place] Hall-Potvin, PA-C  08/03/2019 9:20 AM        Hall-Potvin, [place], PA-C 08/03/19 1013

## 2019-08-03 NOTE — Discharge Instructions (Addendum)
Important to wear wrist brace daily when having flares to help with swelling, pain. Take prednisone as directed with food. Important follow-up with PCP for further evaluation/management. Return for worsening pain, swelling, numbness.

## 2019-08-14 ENCOUNTER — Ambulatory Visit (INDEPENDENT_AMBULATORY_CARE_PROVIDER_SITE_OTHER)
Admission: RE | Admit: 2019-08-14 | Discharge: 2019-08-14 | Disposition: A | Discharge status: Home / Self Care | Payer: 59 | Source: Ambulatory Visit

## 2019-08-14 DIAGNOSIS — G5603 Carpal tunnel syndrome, bilateral upper limbs: Secondary | ICD-10-CM | POA: Diagnosis not present

## 2019-08-14 MED ORDER — NAPROXEN 500 MG PO TABS: 500.0000 mg | ORAL_TABLET | Freq: Two times a day (BID) | ORAL | 0 refills | Status: DC

## 2019-08-14 NOTE — ED Provider Notes (Signed)
Virtual Visit via Video Note:  Megan Benson  initiated request for Telemedicine visit with [facility] team. I connected with Megan Benson  on 08/14/2019 at 12:57 PM  for a synchronized telemedicine visit using a video enabled HIPPA compliant telemedicine application. I verified that I am speaking with Megan Benson  using two identifiers. Megan Eagles, PA-C  was physically located in a [facility] Urgent care site and Megan Benson was located at a different location.   The limitations of evaluation and management by telemedicine as well as the availability of in-person appointments were discussed. Patient was informed that she  may incur a bill ( including co-pay) for this virtual visit encounter. Megan Benson  expressed understanding and gave verbal consent to proceed with virtual visit.     History of Present Illness:Megan Benson  is a 29 y.o. female presents with concerns about carpal tunnel syndrome. Has had persistent numbness, pain and tingling of first 3 and part of 4th fingers down to palmar surface, has sx in both hands. Wears wrist splints at night. Was prescribed prednisone which helped but is finished with this medication. 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   ROS  Current Outpatient Medications  Medication Sig Dispense Refill  . diclofenac (VOLTAREN) 25 MG EC tablet Take 1 tablet (25 mg total) by mouth 2 (two) times daily. 20 tablet 0   No current facility-administered medications for this encounter.     No Known Allergies   Past Medical History:  Diagnosis Date  . COVID-19 2021    Past Surgical History:  Procedure Laterality Date  . ECTOPIC PREGNANCY SURGERY        Observations/Objective: Physical Exam Constitutional:      General: She is not in acute distress.    Appearance: Normal appearance. She is well-developed. She is not ill-appearing, toxic-appearing or diaphoretic.  Eyes:   Extraocular Movements: Extraocular movements intact.  Pulmonary:     Effort: Pulmonary effort is normal.  Musculoskeletal:       Hands:  Neurological:     General: No focal deficit present.     Mental Status: She is alert and oriented to person, place, and time.  Psychiatric:        Mood and Affect: Mood normal.        Behavior: Behavior normal.        Thought Content: Thought content normal.        Judgment: Judgment normal.     Assessment and Plan:  1. Bilateral carpal tunnel syndrome    Will avoid a second round of prednisone despite patient of the risks of frequent use of steroids.  Recommended she use naproxen for pain and inflammation, continue to use response at night.  Use icing after each work shift.  Recommended she follow-up with an orthopedist for consult, provided her with information to 2 practices. Counseled patient on potential for adverse effects with medications prescribed/recommended today, ER and return-to-clinic precautions discussed, patient verbalized understanding.    Follow Up Instructions:    I discussed the assessment and treatment plan with the patient. The patient was provided an opportunity to ask questions and all were answered. The patient agreed with the plan and demonstrated an understanding of the instructions.   The patient was advised to call back or seek an in-person evaluation if the symptoms worsen or if the condition fails to improve as anticipated.  I provided 10 minutes of  non-face-to-face time during this encounter.    Megan Eagles, PA-C  08/14/2019 12:57 PM         Megan Eagles, PA-C 08/14/19 1310

## 2019-08-22 DIAGNOSIS — G5603 Carpal tunnel syndrome, bilateral upper limbs: Secondary | ICD-10-CM | POA: Insufficient documentation

## 2020-09-02 ENCOUNTER — Other Ambulatory Visit: Payer: Self-pay

## 2020-09-02 ENCOUNTER — Encounter: Payer: Self-pay | Admitting: Emergency Medicine

## 2020-09-02 ENCOUNTER — Ambulatory Visit
Admission: EM | Admit: 2020-09-02 | Discharge: 2020-09-02 | Disposition: A | Discharge status: Home / Self Care | Payer: 59 | Attending: Family Medicine | Admitting: Family Medicine

## 2020-09-02 DIAGNOSIS — Z23 Encounter for immunization: Secondary | ICD-10-CM | POA: Diagnosis not present

## 2020-09-02 DIAGNOSIS — S61211A Laceration without foreign body of left index finger without damage to nail, initial encounter: Secondary | ICD-10-CM | POA: Diagnosis not present

## 2020-09-02 MED ORDER — TETANUS-DIPHTH-ACELL PERTUSSIS 5-2.5-18.5 LF-MCG/0.5 IM SUSY
0.5000 mL | PREFILLED_SYRINGE | Freq: Once | INTRAMUSCULAR | Status: AC
  Administered 2020-09-02: 0.5 mL via INTRAMUSCULAR

## 2020-09-02 NOTE — Discharge Instructions (Signed)
Keep clean.  May get wet after 24 hours.  Do not use antibiotic ointment. Let glue wear off.  May cover if desired.

## 2020-09-02 NOTE — ED Provider Notes (Addendum)
[facility]    CSN: [id number] Arrival date & time: 09/02/20  1439      History   Chief Complaint Chief Complaint  Patient presents with  . Laceration   HPI  30 year old female presents with the above complaint.  Patient cut her left index finger while she was at work approximate 3 hours ago with a box cutter.  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  She states her pain is 10/10 in severity.  Bleeding is well controlled.  She is unsure of her last tetanus. No relieving factors. No other complaints.   Past Medical History:  Diagnosis Date  . COVID-19 2021   Past Surgical History:  Procedure Laterality Date  . ECTOPIC PREGNANCY SURGERY      OB History   No obstetric history on file.    Home Medications    Prior to Admission medications   Not on File    Family History Family History  Problem Relation Age of Onset  . Healthy Mother   . Healthy Father     Social History Social History   Tobacco Use  . Smoking status: Never Smoker  . Smokeless tobacco: Never Used  Substance Use Topics  . Alcohol use: No  . Drug use: Yes    Frequency: 7.0 times per week    Types: Marijuana     Allergies   Patient has no known allergies.   Review of Systems Review of Systems  Constitutional: Negative.   Skin: Positive for wound.   Physical Exam Triage Vital Signs ED Triage Vitals  Enc Vitals Group     BP 09/02/20 1450 138/68     Pulse Rate 09/02/20 1450 66     Resp 09/02/20 1450 18     Temp 09/02/20 1450 98 F (36.7 C)     Temp Source 09/02/20 1450 Oral     SpO2 09/02/20 1450 99 %     Weight 09/02/20 1448 264 lb (119.7 kg)     Height 09/02/20 1448 5' (1.524 m)     Head Circumference --      Peak Flow --      Pain Score 09/02/20 1448 10     Pain Loc --      Pain Edu? --      Excl. in [place]? --    Updated Vital Signs BP 138/68 (BP Location: Right Arm)   Pulse 66   Temp 98 F (36.7 C) (Oral)   Resp 18   Ht 5'  (1.524 m)   Wt 119.7 kg   LMP 07/30/2020   SpO2 99%   BMI 51.56 kg/m   Visual Acuity Right Eye Distance:   Left Eye Distance:   Bilateral Distance:    Right Eye Near:   Left Eye Near:    Bilateral Near:     Physical Exam Vitals and nursing note reviewed.  Constitutional:      General: She is not in acute distress.    Appearance: Normal appearance. She is obese. She is not ill-appearing.  HENT:     Head: Normocephalic and atraumatic.  Eyes:     General:        Right eye: No discharge.        Left eye: No discharge.     Conjunctiva/sclera: Conjunctivae normal.  Skin:    Comments: Left index finger -palmar aspect of the distal index finger with a superficial linear laceration which is well approximated.  Measures approximate 1.5 cm.  Neurological:     Mental Status: She is alert.  Psychiatric:        Mood and Affect: Mood normal.        Behavior: Behavior normal.    UC Treatments / Results  Labs (all labs ordered are listed, but only abnormal results are displayed) Labs Reviewed - No data to display  EKG   Radiology No results found.  Procedures Laceration Repair  Date/Time: 09/02/2020 3:20 PM Performed by: Coral Spikes, DO Authorized by: Coral Spikes, DO   Consent:    Consent obtained:  Verbal   Consent given by:  Patient Anesthesia:    Anesthesia method:  None Laceration details:    Location:  Finger   Finger location:  L index finger   Length (cm):  1.5 Exploration:    Hemostasis achieved with:  Direct pressure Treatment:    Wound cleansed with: Wound cleanser.   Amount of cleaning:  Standard Skin repair:    Repair method:  Tissue adhesive Approximation:    Approximation:  Close Repair type:    Repair type:  Simple Post-procedure details:    Procedure completion:  Tolerated well, no immediate complications   (including critical care time)  Medications Ordered in UC Medications  Tdap (BOOSTRIX) injection 0.5 mL (has no administration  in time range)    Initial Impression / Assessment and Plan / UC Course  I have reviewed the triage vital signs and the nursing notes.  Pertinent labs & imaging results that were available during my care of the patient were reviewed by me and considered in my medical decision making (see chart for details).    30 year old female presents for laceration.  Superficial.  Was closed via Dermabond.  See above.  Tetanus given.  Supportive care.  Final Clinical Impressions(s) / UC Diagnoses   Final diagnoses:  Laceration of left index finger without foreign body without damage to nail, initial encounter     Discharge Instructions     Keep clean.  May get wet after 24 hours.  Do not use antibiotic ointment. Let glue wear off.  May cover if desired.   ED Prescriptions    None     PDMP not reviewed this encounter.     Coral Spikes, [place] 09/02/20 1521

## 2020-09-02 NOTE — ED Triage Notes (Signed)
Patient states she cut her left pointer finger on a box cutter about 3 hours ago.

## 2022-07-03 ENCOUNTER — Telehealth: Payer: 59 | Admitting: Physician Assistant

## 2022-07-03 DIAGNOSIS — G5603 Carpal tunnel syndrome, bilateral upper limbs: Secondary | ICD-10-CM

## 2022-07-03 MED ORDER — PREDNISONE 10 MG (21) PO TBPK: ORAL_TABLET | ORAL | 0 refills | Status: AC

## 2022-07-03 NOTE — Progress Notes (Signed)
Virtual Visit Consent   Megan Benson, you are scheduled for a virtual visit with a [facility] provider today. Just as with appointments in the office, your consent must be obtained to participate. Your consent will be active for this visit and any virtual visit you may have with one of our providers in the next 365 days. If you have a MyChart account, a copy of this consent can be sent to you electronically.  As this is a virtual visit, video technology does not allow for your provider to perform a traditional examination. This may limit your provider's ability to fully assess your condition. If your provider identifies any concerns that need to be evaluated in person or the need to arrange testing (such as labs, EKG, etc.), we will make arrangements to do so. Although advances in technology are sophisticated, we cannot ensure that it will always work on either your end or our end. If the connection with a video visit is poor, the visit may have to be switched to a telephone visit. With either a video or telephone visit, we are not always able to ensure that we have a secure connection.  By engaging in this virtual visit, you consent to the provision of healthcare and authorize for your insurance to be billed (if applicable) for the services provided during this visit. Depending on your insurance coverage, you may receive a charge related to this service.  I need to obtain your verbal consent now. Are you willing to proceed with your visit today? Megan Benson has provided verbal consent on 07/03/2022 for a virtual visit (video or telephone). Leeanne Rio, [place]  Date: 07/03/2022 12:45 PM  Virtual Visit via Video Note   I, Leeanne Rio, connected with  Megan Benson  ([id number], 1990-11-21) on 07/03/22 at 12:15 PM EST by a video-enabled telemedicine application and verified that I am speaking with the correct person using two identifiers.  Location: Patient: Virtual  Visit Location Patient: Home Provider: Virtual Visit Location Provider: Home Office   I discussed the limitations of evaluation and management by telemedicine and the availability of in person appointments. The patient expressed understanding and agreed to proceed.    History of Present Illness: Megan Benson is a 32 y.o. who identifies as a female who was assigned female at birth, and is being seen today for flare of her bilateral carpal tunnel which has been an issue for her intermittently over the past few years. Has been doing well up until past 1.5 weeks. Noting bilateral wrist pain R>L with some associated tingling in wrist and first three fingers. Occasional numbness. Denies elbow pain. Symptoms seem worse at night. Has to do repetitive tasks at work that may contribute. No longer has her previous braces  HPI: HPI  Problems:  Patient Active Problem List   Diagnosis Date Noted   Bilateral carpal tunnel syndrome 08/22/2019    Allergies: No Known Allergies Medications:  Current Outpatient Medications:    predniSONE (STERAPRED UNI-PAK 21 TAB) 10 MG (21) TBPK tablet, Take following package directions, Disp: 21 tablet, Rfl: 0  Observations/Objective: Patient is well-developed, well-nourished in no acute distress.  Resting comfortably  at home.  Head is normocephalic, atraumatic.  No labored breathing.  Speech is clear and coherent with logical content.  Patient is alert and oriented at baseline.   Assessment and Plan: 1. Bilateral carpal tunnel syndrome - predniSONE (STERAPRED UNI-PAK 21 TAB) 10 MG (21) TBPK tablet; Take following package directions  Dispense: 21 tablet; Refill: 0  She is to restart wrist braces bilaterally. Can get from pharmacy. Wear at night and when possible in evenings. Ice as discussed. OTC Tylenol. Prednisone per orders. Follow-up in person if not resolving.   Follow Up Instructions: I discussed the assessment and treatment plan with the patient. The  patient was provided an opportunity to ask questions and all were answered. The patient agreed with the plan and demonstrated an understanding of the instructions.  A copy of instructions were sent to the patient via MyChart unless otherwise noted below.   The patient was advised to call back or seek an in-person evaluation if the symptoms worsen or if the condition fails to improve as anticipated.  Time:  I spent 10 minutes with the patient via telehealth technology discussing the above problems/concerns.    Leeanne Rio, PA-C

## 2022-07-03 NOTE — Patient Instructions (Signed)
  Megan Benson, thank you for joining Leeanne Rio, PA-C for today's virtual visit.  While this provider is not your primary care provider (PCP), if your PCP is located in our provider database this encounter information will be shared with them immediately following your visit.   [facility] account gives you access to today's visit and all your visits, tests, and labs performed at [facility] " click here if you don't have a [facility] account or go to [email or web address]  Consent: (Patient) Megan Benson provided verbal consent for this virtual visit at the beginning of the encounter.  Current Medications: No current outpatient medications on file.   Medications ordered in this encounter:  No orders of the defined types were placed in this encounter.    *If you need refills on other medications prior to your next appointment, please contact your pharmacy*  Follow-Up: Call back or seek an in-person evaluation if the symptoms worsen or if the condition fails to improve as anticipated.  [facility] [phone]  Other Instructions Ice and elevate wrists when resting. OTC Tylenol. Restart use of wrist braces, especially at night. Take the prednisone as directed. If not resolving or any new/worsening symptoms, you will need an in-person evaluation.    If you have been instructed to have an in-person evaluation today at a local Urgent Care facility, please use the link below. It will take you to a list of all of our available [facility], including address, phone number and hours of operation. Please do not delay care.  [facility]  If you or a family member do not have a primary care provider, use the link below to schedule a visit and establish care. When you choose a [facility] primary care physician or advanced practice provider, you gain a long-term partner in health. Find a Primary  Care Provider  Learn more about 's in-office and virtual care options: [facility]
